# Patient Record
Sex: Male | Born: 1985
Health system: Southern US, Community
[De-identification: ages and names within clinical notes are randomized; demographics above are authoritative.]

## PROBLEM LIST (undated history)

## (undated) DIAGNOSIS — T7840XA Allergy, unspecified, initial encounter: Secondary | ICD-10-CM

## (undated) DIAGNOSIS — K219 Gastro-esophageal reflux disease without esophagitis: Secondary | ICD-10-CM

## (undated) DIAGNOSIS — J302 Other seasonal allergic rhinitis: Secondary | ICD-10-CM

## (undated) DIAGNOSIS — J45990 Exercise induced bronchospasm: Secondary | ICD-10-CM

## (undated) DIAGNOSIS — K5792 Diverticulitis of intestine, part unspecified, without perforation or abscess without bleeding: Secondary | ICD-10-CM

## (undated) HISTORY — DX: Exercise induced bronchospasm: J45.990

## (undated) HISTORY — DX: Allergy, unspecified, initial encounter: T78.40XA

## (undated) HISTORY — PX: OTHER SURGICAL HISTORY: SHX169

## (undated) HISTORY — DX: Other seasonal allergic rhinitis: J30.2

## (undated) HISTORY — PX: INNER EAR SURGERY: SHX679

## (undated) HISTORY — DX: Gastro-esophageal reflux disease without esophagitis: K21.9

## (undated) HISTORY — PX: NASAL SEPTUM SURGERY: SHX37

---

## 2004-02-14 ENCOUNTER — Other Ambulatory Visit: Payer: Self-pay

## 2004-02-18 ENCOUNTER — Other Ambulatory Visit: Payer: Self-pay

## 2009-05-25 ENCOUNTER — Ambulatory Visit: Payer: Self-pay | Admitting: Internal Medicine

## 2012-01-05 ENCOUNTER — Ambulatory Visit: Payer: Self-pay | Admitting: Otolaryngology

## 2012-08-14 DIAGNOSIS — K5792 Diverticulitis of intestine, part unspecified, without perforation or abscess without bleeding: Secondary | ICD-10-CM

## 2012-08-14 HISTORY — DX: Diverticulitis of intestine, part unspecified, without perforation or abscess without bleeding: K57.92

## 2013-04-03 ENCOUNTER — Ambulatory Visit: Payer: Self-pay | Admitting: Otolaryngology

## 2013-06-05 ENCOUNTER — Ambulatory Visit: Payer: Self-pay | Admitting: Otolaryngology

## 2013-07-14 HISTORY — PX: COLECTOMY: SHX59

## 2013-08-14 HISTORY — PX: WISDOM TOOTH EXTRACTION: SHX21

## 2013-08-14 HISTORY — PX: TONSILLECTOMY: SUR1361

## 2014-03-26 ENCOUNTER — Ambulatory Visit: Payer: Self-pay | Admitting: Otolaryngology

## 2015-05-13 ENCOUNTER — Encounter: Payer: Self-pay | Admitting: Family Medicine

## 2015-05-13 ENCOUNTER — Ambulatory Visit (INDEPENDENT_AMBULATORY_CARE_PROVIDER_SITE_OTHER): Payer: BLUE CROSS/BLUE SHIELD | Admitting: Family Medicine

## 2015-05-13 VITALS — BP 118/80 | HR 72 | Temp 98.8°F | Ht 71.0 in | Wt 205.0 lb

## 2015-05-13 DIAGNOSIS — J0100 Acute maxillary sinusitis, unspecified: Secondary | ICD-10-CM

## 2015-05-13 MED ORDER — AMOXICILLIN 500 MG PO CAPS: 500.0000 mg | ORAL_CAPSULE | Freq: Three times a day (TID) | ORAL | Status: DC

## 2015-05-13 MED ORDER — PREDNISONE 10 MG PO TABS: 10.0000 mg | ORAL_TABLET | Freq: Every day | ORAL | Status: DC

## 2015-05-13 MED ORDER — GUAIFENESIN-CODEINE 100-10 MG/5ML PO SOLN: 5.0000 mL | Freq: Three times a day (TID) | ORAL | Status: DC | PRN

## 2015-05-13 NOTE — Progress Notes (Signed)
Name: Brian Reeves   MRN: [id number]    DOB: 1986/06/07   Date:05/13/2015       Progress Note  Subjective  Chief Complaint  Chief Complaint  Patient presents with  . Sinusitis    yellow production, cough, fever in the pm, congestion    Sinusitis This is a new problem. The current episode started in the past 7 days. The problem has been gradually improving since onset. The maximum temperature recorded prior to his arrival was 100.4 - 100.9 F. The pain is moderate. Associated symptoms include chills, congestion, coughing, diaphoresis, headaches, sinus pressure and swollen glands. Pertinent negatives include no ear pain, neck pain, shortness of breath or sore throat. Past treatments include acetaminophen.    No problem-specific assessment & plan notes found for this encounter.   No past medical history on file.  Past Surgical History  Procedure Laterality Date  . Inner ear surgery      ruptured eardrum  . Meckel diverticulum excision    . Tonsillectomy    . Nasal septum surgery    . Arm fracture Left     No family history on file.  Social History   Social History  . Marital Status: Single    Spouse Name: N/A  . Number of Children: N/A  . Years of Education: N/A   Occupational History  . Not on file.   Social History Main Topics  . Smoking status: Never Smoker   . Smokeless tobacco: Not on file  . Alcohol Use: 0.0 oz/week    0 Standard drinks or equivalent per week  . Drug Use: No  . Sexual Activity: Not on file   Other Topics Concern  . Not on file   Social History Narrative  . No narrative on file    No Known Allergies   Review of Systems  Constitutional: Positive for chills and diaphoresis. Negative for fever, weight loss and malaise/fatigue.  HENT: Positive for congestion and sinus pressure. Negative for ear discharge, ear pain and sore throat.   Eyes: Negative for blurred vision.  Respiratory: Positive for cough. Negative for sputum production,  shortness of breath and wheezing.   Cardiovascular: Negative for chest pain, palpitations and leg swelling.  Gastrointestinal: Negative for heartburn, nausea, abdominal pain, diarrhea, constipation, blood in stool and melena.  Genitourinary: Negative for dysuria, urgency, frequency and hematuria.  Musculoskeletal: Negative for myalgias, back pain, joint pain and neck pain.  Skin: Negative for rash.  Neurological: Positive for headaches. Negative for dizziness, tingling, sensory change and focal weakness.  Endo/Heme/Allergies: Negative for environmental allergies and polydipsia. Does not bruise/bleed easily.  Psychiatric/Behavioral: Negative for depression and suicidal ideas. The patient is not nervous/anxious and does not have insomnia.      Objective  Filed Vitals:   05/13/15 1000  BP: 118/80  Pulse: 72  Temp: 98.8 F (37.1 C)  TempSrc: Oral  Height:  (1.803 m)  Weight: 205 lb (92.987 kg)    Physical Exam  Constitutional: He is oriented to person, place, and time and well-developed, well-nourished, and in no distress.  HENT:  Head: Normocephalic.  Right Ear: External ear normal.  Left Ear: External ear normal.  Nose: Nose normal.  Mouth/Throat: Oropharynx is clear and moist.  Eyes: Conjunctivae and EOM are normal. Pupils are equal, round, and reactive to light. Right eye exhibits no discharge. Left eye exhibits no discharge. No scleral icterus.  Neck: Normal range of motion. Neck supple. No JVD present. No tracheal deviation present. No  thyromegaly present.  Cardiovascular: Normal rate, regular rhythm, normal heart sounds and intact distal pulses.  Exam reveals no gallop and no friction rub.   No murmur heard. Pulmonary/Chest: Breath sounds normal. No respiratory distress. He has no wheezes. He has no rales.  Abdominal: Soft. Bowel sounds are normal. He exhibits no mass. There is no hepatosplenomegaly. There is no tenderness. There is no rebound, no guarding and no CVA  tenderness.  Musculoskeletal: Normal range of motion. He exhibits no edema or tenderness.  Lymphadenopathy:    He has no cervical adenopathy.  Neurological: He is alert and oriented to person, place, and time. He has normal sensation, normal strength, normal reflexes and intact cranial nerves. No cranial nerve deficit.  Skin: Skin is warm. No rash noted.  Psychiatric: Mood and affect normal.      Assessment & Plan  Problem List Items Addressed This Visit    None    Visit Diagnoses    Acute maxillary sinusitis, recurrence not specified    -  Primary    Relevant Medications    fexofenadine-pseudoephedrine (ALLEGRA-D 24) 180-240 MG 24 hr tablet    amoxicillin (AMOXIL) 500 MG capsule    guaiFENesin-codeine 100-10 MG/5ML syrup    predniSONE (DELTASONE) 10 MG tablet         Dr. [facility]  05/13/2015

## 2015-11-22 ENCOUNTER — Ambulatory Visit (INDEPENDENT_AMBULATORY_CARE_PROVIDER_SITE_OTHER): Payer: BLUE CROSS/BLUE SHIELD | Admitting: Family Medicine

## 2015-11-22 ENCOUNTER — Encounter: Payer: Self-pay | Admitting: Family Medicine

## 2015-11-22 ENCOUNTER — Telehealth: Payer: Self-pay

## 2015-11-22 VITALS — BP 130/70 | HR 78 | Ht 71.0 in | Wt 208.0 lb

## 2015-11-22 DIAGNOSIS — G4730 Sleep apnea, unspecified: Secondary | ICD-10-CM | POA: Diagnosis not present

## 2015-11-22 DIAGNOSIS — S161XXA Strain of muscle, fascia and tendon at neck level, initial encounter: Secondary | ICD-10-CM

## 2015-11-22 DIAGNOSIS — R194 Change in bowel habit: Secondary | ICD-10-CM

## 2015-11-22 DIAGNOSIS — R198 Other specified symptoms and signs involving the digestive system and abdomen: Secondary | ICD-10-CM | POA: Diagnosis not present

## 2015-11-22 DIAGNOSIS — J029 Acute pharyngitis, unspecified: Secondary | ICD-10-CM

## 2015-11-22 DIAGNOSIS — L723 Sebaceous cyst: Secondary | ICD-10-CM | POA: Diagnosis not present

## 2015-11-22 LAB — HEMOCCULT GUIAC POC 1CARD (OFFICE): FECAL OCCULT BLD: POSITIVE — AB

## 2015-11-22 MED ORDER — AZITHROMYCIN 250 MG PO TABS: ORAL_TABLET | ORAL | Status: DC

## 2015-11-22 NOTE — Telephone Encounter (Signed)
Dr. Servando SnareWohl told me that he needed to have a colonoscopy done this week since he's been having excessive bowel movements. However, patient doesn't want to have it done this week. Patient was told that you would give him a call with his procedure date.

## 2015-11-22 NOTE — Progress Notes (Signed)
Name: Brian Reeves   MRN: [id number]    DOB: 05/11/1986   Date:11/22/2015       Progress Note  Subjective  Chief Complaint  Chief Complaint  Patient presents with  . Sore Throat    cong- gets better during day, but gets worse at night  . Sleep Apnea    had tonsils removed, stopped snoring- snoring is back and has had witnessed apnea. needs CPAP again  . bowel habits    "excessive bowel movements"    HPI Comments: Patient present for evaluation for sleep apnea.  Patient has had witnessed apnea episodes/ snoring / frequent day time  somnolence/ nonrestful sleep.    Sore Throat  This is a new problem. The current episode started in the past 7 days. The problem has been waxing and waning. There has been no fever. The pain is mild. Associated symptoms include congestion, coughing, diarrhea and neck pain. Pertinent negatives include no abdominal pain, drooling, ear discharge, ear pain, headaches, hoarse voice, plugged ear sensation, shortness of breath, stridor, swollen glands, trouble swallowing or vomiting. He has tried acetaminophen for the symptoms. The treatment provided mild relief.  Neck Pain  This is a chronic problem. The current episode started more than 1 year ago. The problem occurs daily. The problem has been waxing and waning. The pain is associated with nothing. The quality of the pain is described as aching. Pertinent negatives include no chest pain, fever, headaches, leg pain, numbness, pain with swallowing, paresis, photophobia, syncope, tingling, trouble swallowing, visual change, weakness or weight loss. He has tried NSAIDs for the symptoms. The treatment provided mild relief.  Diarrhea  This is a chronic problem. The current episode started more than 1 year ago. The problem occurs 5 to 10 times per day. The problem has been waxing and waning. The stool consistency is described as blood tinged and mucous (tissue). The patient states that diarrhea does not awaken him from  sleep. Associated symptoms include bloating and coughing. Pertinent negatives include no abdominal pain, arthralgias, chills, fever, headaches, myalgias, vomiting or weight loss. Nothing aggravates the symptoms. There are no known risk factors. He has tried nothing for the symptoms. The treatment provided no relief. hx of meckels    No problem-specific assessment & plan notes found for this encounter.   History reviewed. No pertinent past medical history.  Past Surgical History  Procedure Laterality Date  . Inner ear surgery      ruptured eardrum  . Meckel diverticulum excision    . Tonsillectomy    . Nasal septum surgery    . Arm fracture Left     History reviewed. No pertinent family history.  Social History   Social History  . Marital Status: Single    Spouse Name: N/A  . Number of Children: N/A  . Years of Education: N/A   Occupational History  . Not on file.   Social History Main Topics  . Smoking status: Never Smoker   . Smokeless tobacco: Not on file  . Alcohol Use: 0.0 oz/week    0 Standard drinks or equivalent per week  . Drug Use: No  . Sexual Activity: Not on file   Other Topics Concern  . Not on file   Social History Narrative    No Known Allergies   Review of Systems  Constitutional: Negative for fever, chills, weight loss and malaise/fatigue.  HENT: Positive for congestion. Negative for drooling, ear discharge, ear pain, hoarse voice, sore throat and trouble swallowing.  Eyes: Negative for blurred vision and photophobia.  Respiratory: Positive for cough. Negative for sputum production, shortness of breath, wheezing and stridor.   Cardiovascular: Negative for chest pain, palpitations, leg swelling and syncope.  Gastrointestinal: Positive for diarrhea, blood in stool and bloating. Negative for heartburn, nausea, vomiting, abdominal pain, constipation and melena.  Genitourinary: Negative for dysuria, urgency, frequency and hematuria.   Musculoskeletal: Positive for neck pain. Negative for myalgias, back pain, joint pain and arthralgias.  Skin: Negative for rash.  Neurological: Negative for dizziness, tingling, sensory change, focal weakness, weakness, numbness and headaches.  Endo/Heme/Allergies: Negative for environmental allergies and polydipsia. Does not bruise/bleed easily.  Psychiatric/Behavioral: Negative for depression and suicidal ideas. The patient is not nervous/anxious and does not have insomnia.      Objective  Filed Vitals:   11/22/15 1359  BP: 130/70  Pulse: 78  Height:  (1.803 m)  Weight: 208 lb (94.348 kg)    Physical Exam  Constitutional: He is oriented to person, place, and time and well-developed, well-nourished, and in no distress.  HENT:  Head: Normocephalic.  Right Ear: External ear normal.  Left Ear: External ear normal.  Nose: Nose normal.  Mouth/Throat: Oropharynx is clear and moist.  Eyes: Conjunctivae and EOM are normal. Pupils are equal, round, and reactive to light. Right eye exhibits no discharge. Left eye exhibits no discharge. No scleral icterus.  Neck: Normal range of motion. Neck supple. No JVD present. No tracheal deviation present. No thyromegaly present.  Cardiovascular: Normal rate, regular rhythm, normal heart sounds and intact distal pulses.  Exam reveals no gallop and no friction rub.   No murmur heard. Pulmonary/Chest: Breath sounds normal. No respiratory distress. He has no wheezes. He has no rales.  Abdominal: Soft. Bowel sounds are normal. He exhibits no mass. There is no hepatosplenomegaly. There is no tenderness. There is no rebound, no guarding and no CVA tenderness.  Genitourinary: Rectal exam shows tenderness.  Tight anal opening// freq small bowel movement  ? Incomplete evacuation  Musculoskeletal: Normal range of motion. He exhibits no edema or tenderness.  Lymphadenopathy:    He has no cervical adenopathy.  Neurological: He is alert and oriented to  person, place, and time. He has normal sensation, normal strength, normal reflexes and intact cranial nerves. No cranial nerve deficit.  Skin: Skin is warm. No rash noted.  Psychiatric: Mood and affect normal.  Nursing note and vitals reviewed.     Assessment & Plan  Problem List Items Addressed This Visit    None    Visit Diagnoses    Acute pharyngitis, unspecified etiology    -  Primary    Sleep apnea        Relevant Orders    Ambulatory referral to Sleep Studies    Frequent bowel movements        small amounts/  small caliber/ occ blood/ hx of meckel's    Relevant Orders    POCT Occult Blood Stool (Completed)    Ambulatory referral to Gastroenterology    Cervical strain, initial encounter        Sebaceous cyst             Dr. [facility]  11/22/2015

## 2015-11-23 NOTE — Telephone Encounter (Signed)
Brian Reeves, I tried calling patient to let him know that Dr. Yetta BarreJones wanted him to be seen soon and had no answer. I had to leave a voicemail to call us back so we could schedule his colonoscopy this week. I left him my name and number.

## 2015-12-20 ENCOUNTER — Other Ambulatory Visit: Payer: Self-pay

## 2015-12-20 ENCOUNTER — Ambulatory Visit (INDEPENDENT_AMBULATORY_CARE_PROVIDER_SITE_OTHER): Payer: BLUE CROSS/BLUE SHIELD | Admitting: Gastroenterology

## 2015-12-20 ENCOUNTER — Encounter: Payer: Self-pay | Admitting: Gastroenterology

## 2015-12-20 VITALS — BP 132/70 | HR 93 | Temp 98.3°F | Ht 71.0 in | Wt 211.0 lb

## 2015-12-20 DIAGNOSIS — K921 Melena: Secondary | ICD-10-CM

## 2015-12-20 DIAGNOSIS — Q430 Meckel's diverticulum (displaced) (hypertrophic): Secondary | ICD-10-CM

## 2015-12-20 DIAGNOSIS — J45990 Exercise induced bronchospasm: Secondary | ICD-10-CM

## 2015-12-20 DIAGNOSIS — J302 Other seasonal allergic rhinitis: Secondary | ICD-10-CM

## 2015-12-20 HISTORY — DX: Other seasonal allergic rhinitis: J30.2

## 2015-12-20 HISTORY — DX: Exercise induced bronchospasm: J45.990

## 2015-12-20 NOTE — Telephone Encounter (Signed)
Pt [facility]. Pt was scheduled during an OV.

## 2015-12-20 NOTE — Progress Notes (Signed)
Gastroenterology Consultation  Referring Provider:     Duanne Limerick, MD Primary Care Physician:  Elizabeth Sauer, MD Primary Gastroenterologist:  Dr. Servando Snare     Reason for Consultation:     Rectal bleeding         HPI:   Brian Reeves is a 30 y.o. y/o male referred for consultation & management of Urgency with rectal bleeding by Dr. Elizabeth Sauer, MD.  This woman comes in today with a history of having severe abdominal pain in the past and underwent a surgery for resection of a diverticular lesion that he thinks may have been an abscess. The patient thinks he was told it was a cyst but is not sure why reaction remove part of his intestines. The patient now reports that he has had frequent bowel movements every day ever since. He usually moves his bowels approximate 4 times a day. There is no report of any unexplained weight loss. The patient has had rectal bleeding. Is no report of any family history of colon cancer colon polyps. The patient also reports that he has some intermittent abdominal pain. The patient reports that sometimes she will sit down to the bathroom and nothing will come out.  Past Medical History  Diagnosis Date  . Exercise-induced asthma 12/20/2015  . Seasonal allergies 12/20/2015  . Meckel diverticulum 12/20/2015    2014- Open Colectomy in [place]     Past Surgical History  Procedure Laterality Date  . Inner ear surgery  as child    ruptured eardrum  . Meckel diverticulum excision  07/2013    Open Colectomy - [place]  . Tonsillectomy  2015  . Nasal septum surgery  2015 and 2016  . Arm fracture Left   . Wisdom tooth extraction  2015    Prior to Admission medications   Medication Sig Start Date End Date Taking? Authorizing Provider  fexofenadine-pseudoephedrine (ALLEGRA-D 24) 180-240 MG 24 hr tablet Take 1 tablet by mouth daily. otc   Yes Historical Provider, MD  PROAIR HFA 108 (90 BASE) MCG/ACT inhaler Inhale 1 puff into the lungs as needed. 02/22/15   Yes Historical Provider, MD  triamcinolone (NASACORT ALLERGY 24HR) 55 MCG/ACT AERO nasal inhaler Place 2 sprays into the nose daily.   Yes Historical Provider, MD    Family History  Problem Relation Age of Onset  . Hypertension Father   . Cancer Maternal Aunt   . Diabetes Maternal Grandfather      Social History  Substance Use Topics  . Smoking status: Never Smoker   . Smokeless tobacco: Never Used  . Alcohol Use: 0.0 oz/week    0 Standard drinks or equivalent per week     Comment: 6 drinks liquor or wine weekly    Allergies as of 12/20/2015 - Review Complete 12/20/2015  Allergen Reaction Noted  . Peanut-containing drug products  12/20/2015    Review of Systems:    All systems reviewed and negative except where noted in HPI.   Physical Exam:  BP 132/70 mmHg  Pulse 93  Temp(Src) 98.3 F (36.8 C) (Oral)  Ht  (1.803 m)  Wt 211 lb (95.709 kg)  BMI 29.44 kg/m2 No LMP for male patient. Psych:  Alert and cooperative. Normal mood and affect. General:   Alert,  Well-developed, well-nourished, pleasant and cooperative in NAD Head:  Normocephalic and atraumatic. Eyes:  Sclera clear, no icterus.   Conjunctiva pink. Ears:  Normal auditory acuity. Nose:  No deformity, discharge, or lesions.  Mouth:  No deformity or lesions,oropharynx pink & moist. Neck:  Supple; no masses or thyromegaly. Lungs:  Respirations even and unlabored.  Clear throughout to auscultation.   No wheezes, crackles, or rhonchi. No acute distress. Heart:  Regular rate and rhythm; no murmurs, clicks, rubs, or gallops. Abdomen:  Normal bowel sounds.  No bruits.  Soft, non-tender and non-distended without masses, hepatosplenomegaly or hernias noted.  No guarding or rebound tenderness.  Negative Carnett sign.   Rectal:  Deferred.  Msk:  Symmetrical without gross deformities.  Good, equal movement & strength bilaterally. Pulses:  Normal pulses noted. Extremities:  No clubbing or edema.  No  cyanosis. Neurologic:  Alert and oriented x3;  grossly normal neurologically. Skin:  Intact without significant lesions or rashes.  No jaundice. Lymph Nodes:  No significant cervical adenopathy. Psych:  Alert and cooperative. Normal mood and affect.  Imaging Studies: No results found.  Assessment and Plan:   Brian Reeves is a 30 y.o. y/o male who comes in with frequent urgency with frequent bowel movements and rectal bleeding. The patient has been told that his frequency may be attributed to his colonic resection. The patient's rectal bleeding and rectal fullness may be due to inflammation of the rectum. The patient will be set up for a colonoscopy to look for the source of bleeding. I have discussed risks & benefits which include, but are not limited to, bleeding, infection, perforation & drug reaction.  The patient agrees with this plan & written consent will be obtained.      Note: This dictation was prepared with Dragon dictation along with smaller phrase technology. Any transcriptional errors that result from this process are unintentional.

## 2015-12-21 ENCOUNTER — Encounter: Payer: Self-pay | Admitting: *Deleted

## 2015-12-23 NOTE — Discharge Instructions (Signed)

## 2015-12-24 ENCOUNTER — Ambulatory Visit: Payer: BLUE CROSS/BLUE SHIELD | Admitting: Anesthesiology

## 2015-12-24 ENCOUNTER — Encounter
Admission: RE | Disposition: A | Discharge status: Home / Self Care | Payer: Self-pay | Source: Ambulatory Visit | Attending: Gastroenterology

## 2015-12-24 ENCOUNTER — Ambulatory Visit
Admission: RE | Admit: 2015-12-24 | Discharge: 2015-12-24 | Disposition: A | Discharge status: Home / Self Care | Payer: BLUE CROSS/BLUE SHIELD | Source: Ambulatory Visit | Attending: Gastroenterology | Admitting: Gastroenterology

## 2015-12-24 DIAGNOSIS — Z9049 Acquired absence of other specified parts of digestive tract: Secondary | ICD-10-CM | POA: Insufficient documentation

## 2015-12-24 DIAGNOSIS — Z7951 Long term (current) use of inhaled steroids: Secondary | ICD-10-CM | POA: Insufficient documentation

## 2015-12-24 DIAGNOSIS — K921 Melena: Secondary | ICD-10-CM | POA: Insufficient documentation

## 2015-12-24 DIAGNOSIS — Z87891 Personal history of nicotine dependence: Secondary | ICD-10-CM | POA: Insufficient documentation

## 2015-12-24 DIAGNOSIS — Z8249 Family history of ischemic heart disease and other diseases of the circulatory system: Secondary | ICD-10-CM | POA: Diagnosis not present

## 2015-12-24 DIAGNOSIS — Z833 Family history of diabetes mellitus: Secondary | ICD-10-CM | POA: Insufficient documentation

## 2015-12-24 DIAGNOSIS — K640 First degree hemorrhoids: Secondary | ICD-10-CM | POA: Insufficient documentation

## 2015-12-24 DIAGNOSIS — J302 Other seasonal allergic rhinitis: Secondary | ICD-10-CM | POA: Diagnosis not present

## 2015-12-24 DIAGNOSIS — Z79899 Other long term (current) drug therapy: Secondary | ICD-10-CM | POA: Diagnosis not present

## 2015-12-24 DIAGNOSIS — Z91010 Allergy to peanuts: Secondary | ICD-10-CM | POA: Diagnosis not present

## 2015-12-24 HISTORY — PX: COLONOSCOPY WITH PROPOFOL: SHX5780

## 2015-12-24 HISTORY — DX: Diverticulitis of intestine, part unspecified, without perforation or abscess without bleeding: K57.92

## 2015-12-24 SURGERY — COLONOSCOPY WITH PROPOFOL
Anesthesia: Monitor Anesthesia Care | Wound class: Contaminated

## 2015-12-24 MED ORDER — LACTATED RINGERS IV SOLN
INTRAVENOUS | Status: DC
  Administered 2015-12-24 (×2): via INTRAVENOUS

## 2015-12-24 MED ORDER — ACETAMINOPHEN 160 MG/5ML PO SOLN: 325.0000 mg | ORAL | Status: DC | PRN

## 2015-12-24 MED ORDER — PROPOFOL 10 MG/ML IV BOLUS
INTRAVENOUS | Status: DC | PRN
  Administered 2015-12-24: 50 mg via INTRAVENOUS
  Administered 2015-12-24 (×2): 20 mg via INTRAVENOUS
  Administered 2015-12-24: 100 mg via INTRAVENOUS
  Administered 2015-12-24 (×2): 50 mg via INTRAVENOUS
  Administered 2015-12-24: 20 mg via INTRAVENOUS
  Administered 2015-12-24: 50 mg via INTRAVENOUS
  Administered 2015-12-24 (×2): 20 mg via INTRAVENOUS

## 2015-12-24 MED ORDER — STERILE WATER FOR IRRIGATION IR SOLN
Status: DC | PRN
  Administered 2015-12-24: 10:00:00

## 2015-12-24 MED ORDER — LIDOCAINE HCL (CARDIAC) 20 MG/ML IV SOLN
INTRAVENOUS | Status: DC | PRN
  Administered 2015-12-24: 20 mg via INTRAVENOUS

## 2015-12-24 MED ORDER — ACETAMINOPHEN 325 MG PO TABS: 325.0000 mg | ORAL_TABLET | ORAL | Status: DC | PRN

## 2015-12-24 SURGICAL SUPPLY — 22 items
CANISTER SUCT 1200ML W/VALVE (MISCELLANEOUS) ×2 IMPLANT
CLIP HMST 235XBRD CATH ROT (MISCELLANEOUS) IMPLANT
CLIP RESOLUTION 360 11X235 (MISCELLANEOUS)
FCP ESCP3.2XJMB 240X2.8X (MISCELLANEOUS)
FORCEPS BIOP RAD 4 LRG CAP 4 (CUTTING FORCEPS) ×2 IMPLANT
FORCEPS BIOP RJ4 240 W/NDL (MISCELLANEOUS)
FORCEPS ESCP3.2XJMB 240X2.8X (MISCELLANEOUS) IMPLANT
GOWN CVR UNV OPN BCK APRN NK (MISCELLANEOUS) ×2 IMPLANT
GOWN ISOL THUMB LOOP REG UNIV (MISCELLANEOUS) ×2
INJECTOR VARIJECT VIN23 (MISCELLANEOUS) IMPLANT
KIT DEFENDO VALVE AND CONN (KITS) IMPLANT
KIT ENDO PROCEDURE OLY (KITS) ×2 IMPLANT
MARKER SPOT ENDO TATTOO 5ML (MISCELLANEOUS) IMPLANT
PAD GROUND ADULT SPLIT (MISCELLANEOUS) IMPLANT
PROBE APC STR FIRE (PROBE) IMPLANT
SNARE SHORT THROW 13M SML OVAL (MISCELLANEOUS) IMPLANT
SNARE SHORT THROW 30M LRG OVAL (MISCELLANEOUS) IMPLANT
SNARE SNG USE RND 15MM (INSTRUMENTS) IMPLANT
SPOT EX ENDOSCOPIC TATTOO (MISCELLANEOUS)
TRAP ETRAP POLY (MISCELLANEOUS) IMPLANT
VARIJECT INJECTOR VIN23 (MISCELLANEOUS)
WATER STERILE IRR 250ML POUR (IV SOLUTION) ×2 IMPLANT

## 2015-12-24 NOTE — Anesthesia Postprocedure Evaluation (Signed)
Anesthesia Post Note  Patient: Brian Reeves  Procedure(s) Performed: Procedure(s) (LRB): COLONOSCOPY WITH PROPOFOL (N/A)  Patient location during evaluation: PACU Anesthesia Type: MAC Level of consciousness: awake and alert and oriented Pain management: satisfactory to patient Vital Signs Assessment: post-procedure vital signs reviewed and stable Respiratory status: spontaneous breathing, nonlabored ventilation and respiratory function stable Cardiovascular status: blood pressure returned to baseline and stable Postop Assessment: Adequate PO intake and No signs of nausea or vomiting Anesthetic complications: no    Cherly BeachStella, Zaleigh Bermingham J

## 2015-12-24 NOTE — Transfer of Care (Signed)
Immediate Anesthesia Transfer of Care Note  Patient: Brian Reeves  Procedure(s) Performed: Procedure(s) with comments: COLONOSCOPY WITH PROPOFOL (N/A) - LEAVE PT LAST  Patient Location: PACU  Anesthesia Type: MAC  Level of Consciousness: awake, alert  and patient cooperative  Airway and Oxygen Therapy: Patient Spontanous Breathing and Patient connected to supplemental oxygen  Post-op Assessment: Post-op Vital signs reviewed, Patient's Cardiovascular Status Stable, Respiratory Function Stable, Patent Airway and No signs of Nausea or vomiting  Post-op Vital Signs: Reviewed and stable  Complications: No apparent anesthesia complications

## 2015-12-24 NOTE — H&P (Signed)
  [facility]  [address] [place], [place] [address] Phone: [phone] Fax : [phone]-[facility]304-1083  Primary Care Physician:  Elizabeth Sauereanna Jones, MD Primary Gastroenterologist:  Dr. Servando SnareWohl  Pre-Procedure History & Physical: HPI:  Brian MareDouglas Reeves Duggin is a 30 y.o. male is here for an colonoscopy.   Past Medical History  Diagnosis Date  . Exercise-induced asthma 12/20/2015  . Seasonal allergies 12/20/2015  . Diverticulitis 2014    [place]    Past Surgical History  Procedure Laterality Date  . Inner ear surgery  as child    ruptured eardrum  . Tonsillectomy  2015  . Nasal septum surgery  2015 and 2016  . Arm fracture Left   . Wisdom tooth extraction  2015  . [place]    Prior to Admission medications   Medication Sig Start Date End Date Taking? Authorizing Provider  fexofenadine-pseudoephedrine (ALLEGRA-D 24) 180-240 MG 24 hr tablet Take 1 tablet by mouth daily. otc   Yes Historical Provider, MD  PROAIR HFA 108 (90 BASE) MCG/ACT inhaler Inhale 1 puff into the lungs as needed. 02/22/15  Yes Historical Provider, MD  triamcinolone (NASACORT ALLERGY 24HR) 55 MCG/ACT AERO nasal inhaler Place 2 sprays into the nose daily.   Yes Historical Provider, MD    Allergies as of 12/20/2015 - Review Complete 12/20/2015  Allergen Reaction Noted  . Peanut-containing drug products  12/20/2015    Family History  Problem Relation Age of Onset  . Hypertension Father   . Cancer Maternal Aunt   . Diabetes Maternal Grandfather     Social History   Social History  . Marital Status: Single    Spouse Name: N/A  . Number of Children: N/A  . Years of Education: N/A   Occupational History  . Not on file.   Social History Main Topics  . Smoking status: Former Smoker -- 0.50 packs/day for 3 years    Types: Cigarettes  . Smokeless tobacco: Never Used     Comment: quit approx 2009  . Alcohol Use: 3.6 oz/week    0 Standard drinks  or equivalent, 3 Glasses of wine, 3 Shots of liquor per week     Comment:    . Drug Use: No  . Sexual Activity: Not on file   Other Topics Concern  . Not on file   Social History Narrative    Review of Systems: See HPI, otherwise negative ROS  Physical Exam: BP 116/64 mmHg  Pulse 78  Temp(Src) 96.7 F (35.9 Reeves) (Temporal)  Resp 16  Ht 5\' 11"  (1.803 m)  Wt 200 lb (90.719 kg)  BMI 27.91 kg/m2  SpO2 99% General:   Alert,  pleasant and cooperative in NAD Head:  Normocephalic and atraumatic. Neck:  Supple; no masses or thyromegaly. Lungs:  Clear throughout to auscultation.    Heart:  Regular rate and rhythm. Abdomen:  Soft, nontender and nondistended. Normal bowel sounds, without guarding, and without rebound.   Neurologic:  Alert and  oriented x4;  grossly normal neurologically.  Impression/Plan: Brian Reeves Bonnet is here for an colonoscopy to be performed for hematochezia  Risks, benefits, limitations, and alternatives regarding  colonoscopy have been reviewed with the patient.  Questions have been answered.  All parties agreeable.   Midge Miniumarren Aundra Espin, MD  12/24/2015, 8:44 AM

## 2015-12-24 NOTE — Anesthesia Preprocedure Evaluation (Signed)
Anesthesia Evaluation  Patient identified by MRN, date of birth, ID band  Reviewed: Allergy & Precautions, H&P , NPO status , Patient's Chart, lab work & pertinent test results  Airway Mallampati: II  TM Distance: >3 FB Neck ROM: full    Dental no notable dental hx.    Pulmonary asthma , former smoker,    Pulmonary exam normal        Cardiovascular  Rhythm:regular Rate:Normal     Neuro/Psych    GI/Hepatic   Endo/Other    Renal/GU      Musculoskeletal   Abdominal   Peds  Hematology   Anesthesia Other Findings   Reproductive/Obstetrics                             Anesthesia Physical Anesthesia Plan  ASA: II  Anesthesia Plan: MAC   Post-op Pain Management:    Induction:   Airway Management Planned:   Additional Equipment:   Intra-op Plan:   Post-operative Plan:   Informed Consent: I have reviewed the patients History and Physical, chart, labs and discussed the procedure including the risks, benefits and alternatives for the proposed anesthesia with the patient or authorized representative who has indicated his/her understanding and acceptance.     Plan Discussed with: CRNA  Anesthesia Plan Comments:         Anesthesia Quick Evaluation

## 2015-12-24 NOTE — Op Note (Signed)
[facility] Gastroenterology Patient Name: Brian Reeves Procedure Date: 12/24/2015 9:18 AM MRN: [id number] Account #: [id number] Date of Birth: [birth year] Admit Type: Outpatient Age: 30 Room: [facility] OR ROOM 01 Gender: Male Note Status: Finalized Procedure:            Colonoscopy Indications:          Hematochezia Providers:            Midge Minium, MD Referring MD:         Duanne Limerick, MD (Referring MD) Medicines:            Propofol per Anesthesia Complications:        No immediate complications. Procedure:            Pre-Anesthesia Assessment:                       - Prior to the procedure, a History and Physical was                        performed, and patient medications and allergies were                        reviewed. The patient's tolerance of previous                        anesthesia was also reviewed. The risks and benefits of                        the procedure and the sedation options and risks were                        discussed with the patient. All questions were                        answered, and informed consent was obtained. Prior                        Anticoagulants: The patient has taken no previous                        anticoagulant or antiplatelet agents. ASA Grade                        Assessment: II - A patient with mild systemic disease.                        After reviewing the risks and benefits, the patient was                        deemed in satisfactory condition to undergo the                        procedure.                       After obtaining informed consent, the colonoscope was                        passed under direct vision. Throughout the procedure,  the patient's blood pressure, pulse, and oxygen                        saturations were monitored continuously. The Olympus CF                        H180AL colonoscope (S#: [id number]) was introduced through                        the anus  and advanced to the the terminal ileum. The                        colonoscopy was performed without difficulty. The                        patient tolerated the procedure well. The quality of                        the bowel preparation was excellent. Findings:      The perianal and digital rectal examinations were normal.      Non-bleeding internal hemorrhoids were found during retroflexion. The       hemorrhoids were Grade I (internal hemorrhoids that do not prolapse).      The terminal ileum appeared normal. Biopsies were taken with a cold       forceps for histology.      Biopsies were taken with a cold forceps in the entire colon for       histology. Biopsies were taken with a cold forceps in the entire colon       for histology. Impression:           - Non-bleeding internal hemorrhoids.                       - The examined portion of the ileum was normal.                        Biopsied.                       - Biopsies were taken with a cold forceps for histology                        in the entire colon.                       - Biopsies were taken with a cold forceps for histology                        in the entire colon. Recommendation:       - Await pathology results. Procedure Code(s):    --- Professional ---                       [phone], Colonoscopy, flexible; with biopsy, single or                        multiple Diagnosis Code(s):    --- Professional ---                       K92.1, Melena (includes Hematochezia) CPT copyright 2016 American Medical Association.  All rights reserved. The codes documented in this report are preliminary and upon coder review may  be revised to meet current compliance requirements. Midge Minium, MD 12/24/2015 9:39:34 AM This report has been signed electronically. Number of Addenda: 0 Note Initiated On: 12/24/2015 9:18 AM Scope Withdrawal Time: 0 hours 6 minutes 45 seconds  Total Procedure Duration: 0 hours 10 minutes 5 seconds        [facility]

## 2015-12-24 NOTE — Anesthesia Procedure Notes (Signed)
Procedure Name: MAC Performed by: Eunie Lawn Pre-anesthesia Checklist: Patient identified, Emergency Drugs available, Suction available, Patient being monitored and Timeout performed Patient Re-evaluated:Patient Re-evaluated prior to inductionOxygen Delivery Method: Nasal cannula       

## 2015-12-27 ENCOUNTER — Encounter: Payer: Self-pay | Admitting: Gastroenterology

## 2015-12-28 ENCOUNTER — Encounter: Payer: Self-pay | Admitting: Gastroenterology

## 2016-09-15 ENCOUNTER — Ambulatory Visit (INDEPENDENT_AMBULATORY_CARE_PROVIDER_SITE_OTHER): Payer: 59 | Admitting: Family Medicine

## 2016-09-15 ENCOUNTER — Encounter: Payer: Self-pay | Admitting: Family Medicine

## 2016-09-15 VITALS — BP 100/62 | HR 72 | Temp 99.5°F | Ht 71.0 in | Wt 209.0 lb

## 2016-09-15 DIAGNOSIS — J40 Bronchitis, not specified as acute or chronic: Secondary | ICD-10-CM | POA: Diagnosis not present

## 2016-09-15 DIAGNOSIS — R69 Illness, unspecified: Secondary | ICD-10-CM

## 2016-09-15 DIAGNOSIS — J111 Influenza due to unidentified influenza virus with other respiratory manifestations: Secondary | ICD-10-CM

## 2016-09-15 MED ORDER — GUAIFENESIN-CODEINE 100-10 MG/5ML PO SYRP: 5.0000 mL | ORAL_SOLUTION | Freq: Three times a day (TID) | ORAL | 0 refills | Status: DC | PRN

## 2016-09-15 MED ORDER — OSELTAMIVIR PHOSPHATE 75 MG PO CAPS: 75.0000 mg | ORAL_CAPSULE | Freq: Two times a day (BID) | ORAL | 0 refills | Status: DC

## 2016-09-15 MED ORDER — AZITHROMYCIN 250 MG PO TABS: ORAL_TABLET | ORAL | 1 refills | Status: DC

## 2016-09-15 NOTE — Progress Notes (Signed)
Name: Brian Reeves   MRN: [id number]    DOB: 04/30/1986   Date:09/15/2016       Progress Note  Subjective  Chief Complaint  Chief Complaint  Patient presents with  . Sinusitis    green production, fever 100.0- gets sick 50 % of the time flying    Sinusitis  This is a new problem. The current episode started in the past 7 days. The problem has been waxing and waning since onset. The maximum temperature recorded prior to his arrival was 100.4 - 100.9 F. The fever has been present for 1 to 2 days. Associated symptoms include chills, congestion and coughing. Pertinent negatives include no diaphoresis, ear pain, headaches, hoarse voice, neck pain, shortness of breath, sinus pressure, sneezing, sore throat or swollen glands. Past treatments include nothing. The treatment provided mild relief.  Cough  This is a chronic problem. The current episode started in the past 7 days. The problem has been waxing and waning. The cough is productive of purulent sputum (yellow). Associated symptoms include chills, a fever, myalgias, nasal congestion, postnasal drip and wheezing. Pertinent negatives include no chest pain, ear congestion, ear pain, headaches, heartburn, hemoptysis, rash, rhinorrhea, sore throat, shortness of breath, sweats or weight loss. The treatment provided mild relief. There is no history of environmental allergies.  Fever   This is a new problem. The current episode started in the past 7 days. The problem occurs intermittently. The problem has been waxing and waning. The maximum temperature noted was 100 to 100.9 F. The temperature was taken using an oral thermometer. Associated symptoms include congestion, coughing, muscle aches and wheezing. Pertinent negatives include no abdominal pain, chest pain, diarrhea, ear pain, headaches, nausea, rash, sore throat or urinary pain. The treatment provided mild relief.    No problem-specific Assessment & Plan notes found for this encounter.   Past  Medical History:  Diagnosis Date  . Diverticulitis 2014   [place]lect[place] in [place]  . Exercise-induced asthma 12/20/2015  . Seasonal allergies 12/20/2015    Past Surgical History:  Procedure Laterality Date  . arm fracture Left   . [place]  07[place]   [place]  . COLONOSCOPY WITH PROPOFOL N/A 12/24/2015   Procedure: COLONOSCOPY WITH PROPOFOL;  Surgeon: Midge Miniumarren Wohl, MD;  [facility];  Service: Endoscopy;  Laterality: N/A;  LEAVE PT LAST  . INNER EAR SURGERY  as child   ruptured eardrum  . NASAL SEPTUM SURGERY  2015 and 2016  . TONSILLECTOMY  2015  . WISDOM TOOTH EXTRACTION  2015    Family History  Problem Relation Age of Onset  . Hypertension Father   . Cancer Maternal Aunt   . Diabetes Maternal Grandfather     Social History   Social History  . Marital status: Single    Spouse name: N/A  . Number of children: N/A  . Years of education: N/A   Occupational History  . Not on file.   Social History Main Topics  . Smoking status: Former Smoker    Packs/day: 0.50    Years: 3.00    Types: Cigarettes  . Smokeless tobacco: Never Used     Comment: quit approx 2009  . Alcohol use 3.6 oz/week    3 Glasses of wine, 3 Shots of liquor per week     Comment:    . Drug use: No  . Sexual activity: Not on file   Other Topics Concern  . Not on file  Social History Narrative  . No narrative on file    Allergies  Allergen Reactions  . Peanut-Containing Drug Products Anaphylaxis    (reaction to all nuts)     Review of Systems  Constitutional: Positive for chills and fever. Negative for diaphoresis, malaise/fatigue and weight loss.  HENT: Positive for congestion and postnasal drip. Negative for ear discharge, ear pain, hoarse voice, rhinorrhea, sinus pressure, sneezing and sore throat.   Eyes: Negative for blurred vision.  Respiratory: Positive for cough and wheezing. Negative for hemoptysis, sputum production and shortness of breath.    Cardiovascular: Negative for chest pain, palpitations and leg swelling.  Gastrointestinal: Negative for abdominal pain, blood in stool, constipation, diarrhea, heartburn, melena and nausea.  Genitourinary: Negative for dysuria, frequency, hematuria and urgency.  Musculoskeletal: Positive for myalgias. Negative for back pain, joint pain and neck pain.  Skin: Negative for rash.  Neurological: Negative for dizziness, tingling, sensory change, focal weakness and headaches.  Endo/Heme/Allergies: Negative for environmental allergies and polydipsia. Does not bruise/bleed easily.  Psychiatric/Behavioral: Negative for depression and suicidal ideas. The patient is not nervous/anxious and does not have insomnia.      Objective  Vitals:   09/15/16 0958  BP: 100/62  Pulse: 72  Temp: 99.5 F (37.5 C)  TempSrc: Oral  Weight: 209 lb (94.8 kg)  Height: 5\' 11"  (1.803 m)    Physical Exam  Constitutional: He is oriented to person, place, and time and well-developed, well-nourished, and in no distress.  HENT:  Head: Normocephalic.  Right Ear: External ear normal.  Left Ear: External ear normal.  Nose: Nose normal.  Mouth/Throat: Oropharynx is clear and moist.  Eyes: Conjunctivae and EOM are normal. Pupils are equal, round, and reactive to light. Right eye exhibits no discharge. Left eye exhibits no discharge. No scleral icterus.  Neck: Normal range of motion. Neck supple. No JVD present. No tracheal deviation present. No thyromegaly present.  Cardiovascular: Normal rate, regular rhythm, normal heart sounds and intact distal pulses.  Exam reveals no gallop and no friction rub.   No murmur heard. Pulmonary/Chest: Breath sounds normal. No respiratory distress. He has no wheezes. He has no rales.  Abdominal: Soft. Bowel sounds are normal. He exhibits no mass. There is no hepatosplenomegaly. There is no tenderness. There is no rebound, no guarding and no CVA tenderness.  Musculoskeletal: Normal range  of motion. He exhibits no edema or tenderness.  Lymphadenopathy:    He has no cervical adenopathy.  Neurological: He is alert and oriented to person, place, and time. He has normal sensation, normal strength, normal reflexes and intact cranial nerves. No cranial nerve deficit.  Skin: Skin is warm. No rash noted.  Psychiatric: Mood and affect normal.  Nursing note and vitals reviewed.     Assessment & Plan  Problem List Items Addressed This Visit    None    Visit Diagnoses    Influenza-like illness    -  Primary   Relevant Medications   oseltamivir (TAMIFLU) 75 MG capsule   Bronchitis       Relevant Medications   oseltamivir (TAMIFLU) 75 MG capsule   azithromycin (ZITHROMAX) 250 MG tablet   guaiFENesin-codeine (ROBITUSSIN AC) 100-10 MG/5ML syrup        Dr. [facility]  09/15/16

## 2017-09-28 ENCOUNTER — Encounter: Payer: Self-pay | Admitting: Family Medicine

## 2017-09-28 ENCOUNTER — Ambulatory Visit: Payer: 59 | Admitting: Family Medicine

## 2017-09-28 VITALS — BP 120/80 | HR 100 | Temp 98.0°F | Ht 71.0 in | Wt 221.0 lb

## 2017-09-28 DIAGNOSIS — Z23 Encounter for immunization: Secondary | ICD-10-CM

## 2017-09-28 DIAGNOSIS — E860 Dehydration: Secondary | ICD-10-CM

## 2017-09-28 DIAGNOSIS — R509 Fever, unspecified: Secondary | ICD-10-CM | POA: Diagnosis not present

## 2017-09-28 DIAGNOSIS — A084 Viral intestinal infection, unspecified: Secondary | ICD-10-CM | POA: Diagnosis not present

## 2017-09-28 LAB — POCT INFLUENZA A/B
INFLUENZA A, POC: NEGATIVE
INFLUENZA B, POC: NEGATIVE

## 2017-09-28 MED ORDER — PROMETHAZINE HCL 25 MG PO TABS: 25.0000 mg | ORAL_TABLET | Freq: Three times a day (TID) | ORAL | 0 refills | Status: DC | PRN

## 2017-09-28 NOTE — Patient Instructions (Signed)

## 2017-09-28 NOTE — Progress Notes (Signed)
Name: Brian Reeves   MRN: [id number]    DOB: 05/20/1986   Date:09/28/2017       Progress Note  Subjective  Chief Complaint  Chief Complaint  Patient presents with  . Fever    chills, body aches, vomitting, diarrhea    Fever   This is a new problem. The current episode started yesterday (last night). The problem occurs intermittently. The problem has been waxing and waning. The maximum temperature noted was 99 to 99.9 F. Associated symptoms include diarrhea, headaches, muscle aches, nausea and vomiting. Pertinent negatives include no abdominal pain, chest pain, congestion, coughing, ear pain, rash, sleepiness, sore throat, urinary pain or wheezing. He has tried fluids for the symptoms. The treatment provided mild relief.  Risk factors: sick contacts     No problem-specific Assessment & Plan notes found for this encounter.   Past Medical History:  Diagnosis Date  . Diverticulitis 2014   [place]  . Exercise-induced asthma 12/20/2015  . Seasonal allergies 12/20/2015    Past Surgical History:  Procedure Laterality Date  . arm fracture Left   . [place]LECTOMY  07/2013   [place]  . COLONOSCOPY WITH PROPOFOL N/A 12/24/2015   Procedure: COLONOSCOPY WITH PROPOFOL;  Surgeon: Midge Miniumarren Wohl, MD;  [facility];  Service: Endoscopy;  Laterality: N/A;  LEAVE PT LAST  . INNER EAR SURGERY  as child   ruptured eardrum  . NASAL SEPTUM SURGERY  2015 and 2016  . TONSILLECTOMY  2015  . WISDOM TOOTH EXTRACTION  2015    Family History  Problem Relation Age of Onset  . Hypertension Father   . Cancer Maternal Aunt   . Diabetes Maternal Grandfather     Social History   Socioeconomic History  . Marital status: Single    Spouse name: Not on file  . Number of children: Not on file  . Years of education: Not on file  . Highest education level: Not on file  Social Needs  . Financial resource strain: Not on file  . Food insecurity - worry: Not  on file  . Food insecurity - inability: Not on file  . Transportation needs - medical: Not on file  . Transportation needs - non-medical: Not on file  Occupational History  . Not on file  Tobacco Use  . Smoking status: Former Smoker    Packs/day: 0.50    Years: 3.00    Pack years: 1.50    Types: Cigarettes  . Smokeless tobacco: Never Used  . Tobacco comment: quit approx 2009  Substance and Sexual Activity  . Alcohol use: Yes    Alcohol/week: 3.6 oz    Types: 3 Glasses of wine, 3 Shots of liquor per week    Comment:    . Drug use: No  . Sexual activity: Not on file  Other Topics Concern  . Not on file  Social History Narrative  . Not on file    Allergies  Allergen Reactions  . Peanut-Containing Drug Products Anaphylaxis    (reaction to all nuts)    Outpatient Medications Prior to Visit  Medication Sig Dispense Refill  . fexofenadine-pseudoephedrine (ALLEGRA-D 24) 180-240 MG 24 hr tablet Take 1 tablet by mouth daily. otc    . PROAIR HFA 108 (90 BASE) MCG/ACT inhaler Inhale 1 puff into the lungs as needed.  0  . triamcinolone (NASACORT ALLERGY 24HR) 55 MCG/ACT AERO nasal inhaler Place 2 sprays into the nose daily.    .Marland Kitchen  azithromycin (ZITHROMAX) 250 MG tablet 2 today then 1 a day for 4 day 6 tablet 1  . guaiFENesin-codeine (ROBITUSSIN AC) 100-10 MG/5ML syrup Take 5 mLs by mouth 3 (three) times daily as needed for cough. 150 mL 0  . oseltamivir (TAMIFLU) 75 MG capsule Take 1 capsule (75 mg total) by mouth 2 (two) times daily. 10 capsule 0   No facility-administered medications prior to visit.     Review of Systems  Constitutional: Positive for fever. Negative for chills, malaise/fatigue and weight loss.  HENT: Negative for congestion, ear discharge, ear pain and sore throat.   Eyes: Negative for blurred vision.  Respiratory: Negative for cough, sputum production, shortness of breath and wheezing.   Cardiovascular: Negative for chest pain, palpitations and leg swelling.   Gastrointestinal: Positive for diarrhea, nausea and vomiting. Negative for abdominal pain, blood in stool, constipation, heartburn and melena.  Genitourinary: Negative for dysuria, frequency, hematuria and urgency.  Musculoskeletal: Negative for back pain, joint pain, myalgias and neck pain.  Skin: Negative for rash.  Neurological: Positive for headaches. Negative for dizziness, tingling, sensory change and focal weakness.  Endo/Heme/Allergies: Negative for environmental allergies and polydipsia. Does not bruise/bleed easily.  Psychiatric/Behavioral: Negative for depression and suicidal ideas. The patient is not nervous/anxious and does not have insomnia.      Objective  Vitals:   09/28/17 0845  BP: 120/80  Pulse: 100  Temp: 98 F (36.7 C)  TempSrc: Oral  Weight: 221 lb (100.2 kg)  Height: 5\' 11"  (1.803 m)    Physical Exam  Constitutional: He is oriented to person, place, and time and well-developed, well-nourished, and in no distress.  HENT:  Head: Normocephalic.  Right Ear: Tympanic membrane, external ear and ear canal normal.  Left Ear: Tympanic membrane, external ear and ear canal normal.  Nose: Nose normal.  Mouth/Throat: Oropharynx is clear and moist. Mucous membranes are dry. No oropharyngeal exudate, posterior oropharyngeal edema or posterior oropharyngeal erythema.  Eyes: Conjunctivae and EOM are normal. Pupils are equal, round, and reactive to light. Right eye exhibits no discharge. Left eye exhibits no discharge. No scleral icterus.  Neck: Normal range of motion. Neck supple. No JVD present. No tracheal deviation present. No thyromegaly present.  Cardiovascular: Normal rate, regular rhythm, normal heart sounds and intact distal pulses. Exam reveals no gallop and no friction rub.  No murmur heard. Pulmonary/Chest: Breath sounds normal. No respiratory distress. He has no wheezes. He has no rales.  Abdominal: Soft. Normal aorta and bowel sounds are normal. He exhibits no  mass. There is no hepatosplenomegaly. There is no tenderness. There is no rebound, no guarding and no CVA tenderness.  Musculoskeletal: Normal range of motion. He exhibits no edema or tenderness.  Lymphadenopathy:    He has no cervical adenopathy.  Neurological: He is alert and oriented to person, place, and time. He has normal sensation, normal strength, normal reflexes and intact cranial nerves. No cranial nerve deficit.  Skin: Skin is warm. No rash noted.  Psychiatric: Mood and affect normal.  Nursing note and vitals reviewed.     Assessment & Plan  Problem List Items Addressed This Visit    None    Visit Diagnoses    Fever and chills    -  Primary   Relevant Orders   POCT Influenza A/B (Completed)   Viral gastroenteritis       imodium   Relevant Medications   promethazine (PHENERGAN) 25 MG tablet   Other Relevant Orders   POCT Influenza  A/B (Completed)   Dehydration       Need for Tdap vaccination       Relevant Orders   Tdap vaccine greater than or equal to 7yo IM (Completed)      Meds ordered this encounter  Medications  . promethazine (PHENERGAN) 25 MG tablet    Sig: Take 1 tablet (25 mg total) by mouth every 8 (eight) hours as needed for nausea or vomiting.    Dispense:  16 tablet    Refill:  0      Dr. Elizabeth Sauer [facility]  09/28/17

## 2018-05-08 ENCOUNTER — Ambulatory Visit: Payer: 59 | Admitting: Family Medicine

## 2018-05-08 ENCOUNTER — Encounter: Payer: Self-pay | Admitting: Family Medicine

## 2018-05-08 VITALS — BP 130/70 | HR 100 | Temp 98.3°F | Ht 71.0 in | Wt 220.0 lb

## 2018-05-08 DIAGNOSIS — J40 Bronchitis, not specified as acute or chronic: Secondary | ICD-10-CM | POA: Diagnosis not present

## 2018-05-08 DIAGNOSIS — J0100 Acute maxillary sinusitis, unspecified: Secondary | ICD-10-CM

## 2018-05-08 MED ORDER — GUAIFENESIN-CODEINE 100-10 MG/5ML PO SYRP
5.0000 mL | ORAL_SOLUTION | Freq: Three times a day (TID) | ORAL | 0 refills | Status: DC | PRN
Start: 2018-05-08 — End: 2018-10-09

## 2018-05-08 MED ORDER — AMOXICILLIN-POT CLAVULANATE 875-125 MG PO TABS: 1.0000 | ORAL_TABLET | Freq: Two times a day (BID) | ORAL | 0 refills | Status: DC

## 2018-05-08 NOTE — Progress Notes (Signed)
Date:  05/08/2018   Name:  Brian Reeves   DOB:  1985/09/13   MRN:  [id number]   Chief Complaint: Sore Throat (fever, nasal congestion, thick, yellow production) Sore Throat   This is a new problem. The current episode started yesterday. The problem has been gradually worsening. The maximum temperature recorded prior to his arrival was 100.4 - 100.9 F. The pain is at a severity of 4/10. The pain is moderate. Associated symptoms include congestion, headaches and a hoarse voice. Pertinent negatives include no abdominal pain, coughing, diarrhea, drooling, ear discharge, ear pain, neck pain, shortness of breath, swollen glands, trouble swallowing or vomiting.  Sinusitis  This is a new problem. The current episode started in the past 7 days. The maximum temperature recorded prior to his arrival was 100.4 - 100.9 F. His pain is at a severity of 3/10. The pain is moderate. Associated symptoms include congestion, headaches and a hoarse voice. Pertinent negatives include no chills, coughing, diaphoresis, ear pain, neck pain, shortness of breath, sinus pressure, sneezing, sore throat or swollen glands. The treatment provided moderate relief.     Review of Systems  Constitutional: Negative for chills, diaphoresis and fever.  HENT: Positive for congestion and hoarse voice. Negative for drooling, ear discharge, ear pain, sinus pressure, sneezing, sore throat and trouble swallowing.   Respiratory: Negative for cough, shortness of breath and wheezing.   Cardiovascular: Negative for chest pain, palpitations and leg swelling.  Gastrointestinal: Negative for abdominal pain, blood in stool, constipation, diarrhea, nausea and vomiting.  Endocrine: Negative for polydipsia.  Genitourinary: Negative for dysuria, frequency, hematuria and urgency.  Musculoskeletal: Negative for back pain, myalgias and neck pain.  Skin: Negative for rash.  Allergic/Immunologic: Negative for environmental allergies.    Neurological: Positive for headaches. Negative for dizziness.  Hematological: Does not bruise/bleed easily.  Psychiatric/Behavioral: Negative for suicidal ideas. The patient is not nervous/anxious.     Patient Active Problem List   Diagnosis Date Noted  . Blood in stool   . Meckel diverticulum 12/20/2015  . Seasonal allergies 12/20/2015  . Exercise-induced asthma 12/20/2015    Allergies  Allergen Reactions  . Peanut-Containing Drug Products Anaphylaxis    (reaction to all nuts)    Past Surgical History:  Procedure Laterality Date  . arm fracture Left   . COLECTOMY  07/2013   [place]  . COLONOSCOPY WITH PROPOFOL N/A 12/24/2015   Procedure: COLONOSCOPY WITH PROPOFOL;  Surgeon: Midge Minium, MD;  [facility];  Service: Endoscopy;  Laterality: N/A;  LEAVE PT LAST  . INNER EAR SURGERY  as child   ruptured eardrum  . NASAL SEPTUM SURGERY  2015 and 2016  . TONSILLECTOMY  2015  . WISDOM TOOTH EXTRACTION  2015    Social History   Tobacco Use  . Smoking status: Former Smoker    Packs/day: 0.50    Years: 3.00    Pack years: 1.50    Types: Cigarettes  . Smokeless tobacco: Never Used  . Tobacco comment: quit approx 2009  Substance Use Topics  . Alcohol use: Yes    Alcohol/week: 6.0 standard drinks    Types: 3 Glasses of wine, 3 Shots of liquor per week    Comment:    . Drug use: No     Medication list has been reviewed and updated.  Current Meds  Medication Sig  . triamcinolone (NASACORT ALLERGY 24HR) 55 MCG/ACT AERO nasal inhaler Place 2 sprays into the nose daily.  PHQ 2/9 Scores 09/28/2017 11/22/2015  PHQ - 2 Score 0 0  PHQ- 9 Score 0 -    Physical Exam  Constitutional: He is oriented to person, place, and time. He appears well-developed.  HENT:  Head: Normocephalic.  Right Ear: Tympanic membrane, external ear and ear canal normal.  Left Ear: Tympanic membrane, external ear and ear canal normal.  Nose: Right sinus exhibits  maxillary sinus tenderness. Left sinus exhibits maxillary sinus tenderness.  Mouth/Throat: Uvula is midline and mucous membranes are normal. Posterior oropharyngeal erythema present. No oropharyngeal exudate or posterior oropharyngeal edema.  Eyes: Pupils are equal, round, and reactive to light. Conjunctivae and EOM are normal. Right eye exhibits no discharge. Left eye exhibits no discharge. No scleral icterus.  Neck: Normal range of motion. Neck supple. No JVD present. No tracheal deviation present. No thyromegaly present.  Cardiovascular: Normal rate, regular rhythm, normal heart sounds and intact distal pulses. Exam reveals no gallop and no friction rub.  No murmur heard. Pulmonary/Chest: Breath sounds normal. No respiratory distress. He has no wheezes. He has no rales.  Abdominal: Soft. Bowel sounds are normal. He exhibits no mass. There is no hepatosplenomegaly. There is no tenderness. There is no rebound, no guarding and no CVA tenderness.  Musculoskeletal: Normal range of motion. He exhibits no edema or tenderness.  Lymphadenopathy:    He has no cervical adenopathy.  Neurological: He is alert and oriented to person, place, and time. He has normal strength and normal reflexes. No cranial nerve deficit.  Skin: Skin is warm. No rash noted.  Nursing note and vitals reviewed.   BP 130/70   Pulse 100   Temp 98.3 F (36.8 C)   Ht 5\' 11"  (1.803 m)   Wt 220 lb (99.8 kg)   BMI 30.68 kg/m   Assessment and Plan:  1. Acute maxillary sinusitis, recurrence not specified Start Augmentin - amoxicillin-clavulanate (AUGMENTIN) 875-125 MG tablet; Take 1 tablet by mouth 2 (two) times daily.  Dispense: 20 tablet; Refill: 0  2. Bronchitis Start cough med with Augmentin/ pick up Mucinex D otc - guaiFENesin-codeine (ROBITUSSIN AC) 100-10 MG/5ML syrup; Take 5 mLs by mouth 3 (three) times daily as needed for cough.  Dispense: 100 mL; Refill: 0   Dr. Elizabeth Sauer [facility]  05/08/2018

## 2018-09-30 ENCOUNTER — Encounter: Payer: 59 | Admitting: Family Medicine

## 2018-10-09 ENCOUNTER — Encounter: Payer: Self-pay | Admitting: Family Medicine

## 2018-10-09 ENCOUNTER — Ambulatory Visit (INDEPENDENT_AMBULATORY_CARE_PROVIDER_SITE_OTHER): Payer: 59 | Admitting: Family Medicine

## 2018-10-09 VITALS — BP 120/70 | HR 76 | Ht 71.0 in | Wt 223.0 lb

## 2018-10-09 DIAGNOSIS — Z1211 Encounter for screening for malignant neoplasm of colon: Secondary | ICD-10-CM | POA: Diagnosis not present

## 2018-10-09 DIAGNOSIS — Z0000 Encounter for general adult medical examination without abnormal findings: Secondary | ICD-10-CM

## 2018-10-09 DIAGNOSIS — E663 Overweight: Secondary | ICD-10-CM | POA: Diagnosis not present

## 2018-10-09 LAB — HEMOCCULT GUIAC POC 1CARD (OFFICE): Fecal Occult Blood, POC: NEGATIVE

## 2018-10-09 NOTE — Progress Notes (Signed)
Date:  10/09/2018   Name:  Brian Reeves   DOB:  1986-04-25   MRN:  [id number]   Chief Complaint: Annual Exam  Patient is a 33 year old male who presents for a comprehensive physical exam. The patient reports the following problems: none. Health maintenance has been reviewed up to date.   Review of Systems  Constitutional: Negative for appetite change, chills, fatigue, fever and unexpected weight change.  HENT: Negative for drooling, ear discharge, ear pain, facial swelling, hearing loss, nosebleeds, sneezing, sore throat and trouble swallowing.   Eyes: Negative for photophobia, pain, discharge, redness, itching and visual disturbance.  Respiratory: Negative for cough, choking, chest tightness, shortness of breath and wheezing.   Cardiovascular: Negative for chest pain, palpitations and leg swelling.  Gastrointestinal: Negative for abdominal pain, blood in stool, constipation, diarrhea, nausea, rectal pain and vomiting.  Endocrine: Negative for cold intolerance, heat intolerance, polydipsia, polyphagia and polyuria.  Genitourinary: Negative for decreased urine volume, difficulty urinating, discharge, dysuria, flank pain, frequency, hematuria, penile pain, penile swelling, scrotal swelling, testicular pain and urgency.  Musculoskeletal: Negative for back pain, joint swelling, myalgias, neck pain and neck stiffness.  Skin: Negative for color change and rash.  Allergic/Immunologic: Negative for environmental allergies and immunocompromised state.  Neurological: Negative for dizziness, tremors, seizures, syncope, speech difficulty, weakness, light-headedness, numbness and headaches.  Hematological: Does not bruise/bleed easily.  Psychiatric/Behavioral: Negative for agitation, behavioral problems, confusion, dysphoric mood, hallucinations, self-injury and suicidal ideas. The patient is not nervous/anxious.     Patient Active Problem List   Diagnosis Date Noted  . Blood in stool   .  Meckel diverticulum 12/20/2015  . Seasonal allergies 12/20/2015  . Exercise-induced asthma 12/20/2015    Allergies  Allergen Reactions  . Peanut-Containing Drug Products Anaphylaxis    (reaction to all nuts)    Past Surgical History:  Procedure Laterality Date  . arm fracture Left   . COLECTOMY  07/2013   open.  [place]  . COLONOSCOPY WITH PROPOFOL N/A 12/24/2015   Procedure: COLONOSCOPY WITH PROPOFOL;  Surgeon: Midge Minium, MD;  Location: [facility];  Service: Endoscopy;  Laterality: N/A;  LEAVE PT LAST  . INNER EAR SURGERY  as child   ruptured eardrum  . NASAL SEPTUM SURGERY  2015 and 2016  . TONSILLECTOMY  2015  . WISDOM TOOTH EXTRACTION  2015    Social History   Tobacco Use  . Smoking status: Former Smoker    Packs/day: 0.50    Years: 3.00    Pack years: 1.50    Types: Cigarettes  . Smokeless tobacco: Never Used  . Tobacco comment: quit approx 2009  Substance Use Topics  . Alcohol use: Yes    Alcohol/week: 6.0 standard drinks    Types: 3 Glasses of wine, 3 Shots of liquor per week    Comment:    . Drug use: No     Medication list has been reviewed and updated.  No outpatient medications have been marked as taking for the 10/09/18 encounter (Office Visit) with Duanne Limerick, MD.    [facility] 2/9 Scores 10/09/2018 09/28/2017 11/22/2015  PHQ - 2 Score 0 0 0  PHQ- 9 Score 0 0 -    Physical Exam Vitals signs and nursing note reviewed.  HENT:     Head: Normocephalic.     Jaw: There is normal jaw occlusion.     Right Ear: Hearing, tympanic membrane, ear canal and external ear normal.     Left  Ear: Hearing, tympanic membrane, ear canal and external ear normal.     Nose: Nose normal.     Right Turbinates: Not enlarged.     Left Turbinates: Not enlarged.     Mouth/Throat:     Lips: Pink.     Mouth: Mucous membranes are pale and dry.     Palate: No mass.     Pharynx: Oropharynx is clear. Uvula midline.  Eyes:     General: Lids are normal. No  scleral icterus.       Right eye: No discharge.        Left eye: No discharge.     Conjunctiva/sclera: Conjunctivae normal.     Pupils: Pupils are equal, round, and reactive to light.     Funduscopic exam:    Right eye: No AV nicking or arteriolar narrowing.        Left eye: No AV nicking or arteriolar narrowing.  Neck:     Musculoskeletal: Full passive range of motion without pain, normal range of motion and neck supple.     Thyroid: No thyroid mass, thyromegaly or thyroid tenderness.     Vascular: Normal carotid pulses. No carotid bruit, hepatojugular reflux or JVD.     Trachea: Trachea and phonation normal. No tracheal deviation.  Cardiovascular:     Rate and Rhythm: Normal rate and regular rhythm.  No extrasystoles are present.    Chest Wall: PMI is not displaced. No thrill.     Pulses:          Carotid pulses are 2+ on the right side and 2+ on the left side.      Radial pulses are 2+ on the right side and 2+ on the left side.       Femoral pulses are 2+ on the right side and 2+ on the left side.      Popliteal pulses are 2+ on the right side and 2+ on the left side.       Dorsalis pedis pulses are 2+ on the right side and 2+ on the left side.       Posterior tibial pulses are 2+ on the right side and 2+ on the left side.     Heart sounds: Normal heart sounds, S1 normal and S2 normal. Heart sounds not distant. No murmur. No systolic murmur. No diastolic murmur. No friction rub. No gallop. No S3 or S4 sounds.   Pulmonary:     Effort: Pulmonary effort is normal. No respiratory distress.     Breath sounds: Normal breath sounds. No decreased breath sounds, wheezing, rhonchi or rales.  Chest:     Chest wall: No mass.     Breasts: Breasts are symmetrical.        Right: Normal. No swelling, inverted nipple or nipple discharge.        Left: Normal. No swelling, inverted nipple or nipple discharge.  Abdominal:     General: Bowel sounds are normal.     Palpations: Abdomen is soft. There  is no shifting dullness, hepatomegaly, splenomegaly or mass.     Tenderness: There is no abdominal tenderness. There is no right CVA tenderness, left CVA tenderness, guarding or rebound.  Genitourinary:    Pubic Area: No rash.      Penis: Normal and circumcised.      Scrotum/Testes:        Right: Mass not present.        Left: Mass not present.     Epididymis:  Right: Normal.     Left: Normal.     Prostate: Normal.     Rectum: Normal. Guaiac result negative.  Musculoskeletal: Normal range of motion.        General: No tenderness.     Lumbar back: Normal.     Right lower leg: No edema.     Left lower leg: No edema.  Lymphadenopathy:     Head:     Right side of head: No submandibular adenopathy.     Left side of head: No submandibular adenopathy.     Cervical: No cervical adenopathy.     Right cervical: No superficial cervical adenopathy.    Left cervical: No superficial cervical adenopathy.     Upper Body:     Right upper body: No supraclavicular adenopathy.     Left upper body: No supraclavicular adenopathy.  Skin:    General: Skin is warm.     Capillary Refill: Capillary refill takes less than 2 seconds.     Findings: No rash.  Neurological:     Mental Status: He is alert and oriented to person, place, and time.     Cranial Nerves: No cranial nerve deficit.     Deep Tendon Reflexes: Reflexes are normal and symmetric.  Psychiatric:        Behavior: Behavior is cooperative.     BP 120/70   Pulse 76   Ht 5\' 11"  (1.803 m)   Wt 223 lb (101.2 kg)   BMI 31.10 kg/m   Assessment and Plan: 1. Annual physical exam No subjective objective concerns noted with the history and physical exam.  Review of patient's previous appointments, labs, imaging, and referrals notes no concerns.THANOS CAPAN is a 33 y.o. male who presents today for his Complete Annual Exam. He feels well. He reports exercising . He reports he is sleeping well.  - Comprehensive metabolic panel - Lipid  panel - CBC with Differential/Platelet  2. Overweight Mediterranean diet given.  3. Colon cancer screening 3 was performed and guaiac was negative. - POCT occult blood stool

## 2018-10-09 NOTE — Patient Instructions (Signed)
Mediterranean Diet  A Mediterranean diet refers to food and lifestyle choices that are based on the traditions of countries located on the [place]. This way of eating has been shown to help prevent certain conditions and improve outcomes for people who have chronic diseases, like kidney disease and heart disease.  What are tips for following this plan?  Lifestyle   Cook and eat meals together with your family, when possible.   Drink enough fluid to keep your urine clear or pale yellow.   Be physically active every day. This includes:  ? Aerobic exercise like running or swimming.  ? Leisure activities like gardening, walking, or housework.   Get 7-8 hours of sleep each night.   If recommended by your health care provider, drink red wine in moderation. This means 1 glass a day for nonpregnant women and 2 glasses a day for men. A glass of wine equals 5 oz (150 mL).  Reading food labels     Check the serving size of packaged foods. For foods such as rice and pasta, the serving size refers to the amount of cooked product, not dry.   Check the total fat in packaged foods. Avoid foods that have saturated fat or trans fats.   Check the ingredients list for added sugars, such as corn syrup.  Shopping   At the grocery store, buy most of your food from the areas near the walls of the store. This includes:  ? Fresh fruits and vegetables (produce).  ? Grains, beans, nuts, and seeds. Some of these may be available in unpackaged forms or large amounts (in bulk).  ? Fresh seafood.  ? Poultry and eggs.  ? Low-fat dairy products.   Buy whole ingredients instead of prepackaged foods.   Buy fresh fruits and vegetables in-season from local farmers markets.   Buy frozen fruits and vegetables in resealable bags.   If you do not have access to quality fresh seafood, buy precooked frozen shrimp or canned fish, such as tuna, salmon, or sardines.   Buy small amounts of raw or cooked vegetables, salads, or olives from  the deli or salad bar at your store.   Stock your pantry so you always have certain foods on hand, such as olive oil, canned tuna, canned tomatoes, rice, pasta, and beans.  Cooking   Cook foods with extra-virgin olive oil instead of using butter or other vegetable oils.   Have meat as a side dish, and have vegetables or grains as your main dish. This means having meat in small portions or adding small amounts of meat to foods like pasta or stew.   Use beans or vegetables instead of meat in common dishes like chili or lasagna.   Experiment with different cooking methods. Try roasting or broiling vegetables instead of steaming or sauteing them.   Add frozen vegetables to soups, stews, pasta, or rice.   Add nuts or seeds for added healthy fat at each meal. You can add these to yogurt, salads, or vegetable dishes.   Marinate fish or vegetables using olive oil, lemon juice, garlic, and fresh herbs.  Meal planning     Plan to eat 1 vegetarian meal one day each week. Try to work up to 2 vegetarian meals, if possible.   Eat seafood 2 or more times a week.   Have healthy snacks readily available, such as:  ? Vegetable sticks with hummus.  ? Greek yogurt.  ? Fruit and nut trail mix.   Eat balanced   meals throughout the week. This includes:  ? Fruit: 2-3 servings a day  ? Vegetables: 4-5 servings a day  ? Low-fat dairy: 2 servings a day  ? Fish, poultry, or lean meat: 1 serving a day  ? Beans and legumes: 2 or more servings a week  ? Nuts and seeds: 1-2 servings a day  ? Whole grains: 6-8 servings a day  ? Extra-virgin olive oil: 3-4 servings a day   Limit red meat and sweets to only a few servings a month  What are my food choices?   Mediterranean diet  ? Recommended  ? Grains: Whole-grain pasta. Brown rice. Bulgar wheat. Polenta. Couscous. Whole-wheat bread. Oatmeal. Quinoa.  ? Vegetables: Artichokes. Beets. Broccoli. Cabbage. Carrots. Eggplant. Green beans. Chard. Kale. Spinach. Onions. Leeks. Peas. Squash.  Tomatoes. Peppers. Radishes.  ? Fruits: Apples. Apricots. Avocado. Berries. Bananas. Cherries. Dates. Figs. Grapes. Lemons. Melon. Oranges. Peaches. Plums. Pomegranate.  ? Meats and other protein foods: Beans. Almonds. Sunflower seeds. Pine nuts. Peanuts. Cod. Salmon. Scallops. Shrimp. Tuna. Tilapia. Clams. Oysters. Eggs.  ? Dairy: Low-fat milk. Cheese. Greek yogurt.  ? Beverages: Water. Red wine. Herbal tea.  ? Fats and oils: Extra virgin olive oil. Avocado oil. Grape seed oil.  ? Sweets and desserts: Greek yogurt with honey. Baked apples. Poached pears. Trail mix.  ? Seasoning and other foods: Basil. Cilantro. Coriander. Cumin. Mint. Parsley. Sage. Rosemary. Tarragon. Garlic. Oregano. Thyme. Pepper. Balsalmic vinegar. Tahini. Hummus. Tomato sauce. Olives. Mushrooms.  ? Limit these  ? Grains: Prepackaged pasta or rice dishes. Prepackaged cereal with added sugar.  ? Vegetables: Deep fried potatoes (french fries).  ? Fruits: Fruit canned in syrup.  ? Meats and other protein foods: Beef. Pork. Lamb. Poultry with skin. Hot dogs. Bacon.  ? Dairy: Ice cream. Sour cream. Whole milk.  ? Beverages: Juice. Sugar-sweetened soft drinks. Beer. Liquor and spirits.  ? Fats and oils: Butter. Canola oil. Vegetable oil. Beef fat (tallow). Lard.  ? Sweets and desserts: Cookies. Cakes. Pies. Candy.  ? Seasoning and other foods: Mayonnaise. Premade sauces and marinades.  ? The items listed may not be a complete list. Talk with your dietitian about what dietary choices are right for you.  Summary   The Mediterranean diet includes both food and lifestyle choices.   Eat a variety of fresh fruits and vegetables, beans, nuts, seeds, and whole grains.   Limit the amount of red meat and sweets that you eat.   Talk with your health care provider about whether it is safe for you to drink red wine in moderation. This means 1 glass a day for nonpregnant women and 2 glasses a day for men. A glass of wine equals 5 oz (150 mL).  This information  is not intended to replace advice given to you by your health care provider. Make sure you discuss any questions you have with your health care provider.  Document Released: 03/23/2016 Document Revised: 04/25/2016 Document Reviewed: 03/23/2016  Elsevier Interactive Patient Education  2019 [employer]

## 2018-10-10 LAB — CBC WITH DIFFERENTIAL/PLATELET
BASOS ABS: 0 10*3/uL (ref 0.0–0.2)
Basos: 1 %
EOS (ABSOLUTE): 0.5 10*3/uL — ABNORMAL HIGH (ref 0.0–0.4)
Eos: 7 %
HEMOGLOBIN: 15.9 g/dL (ref 13.0–17.7)
Hematocrit: 46.5 % (ref 37.5–51.0)
IMMATURE GRANS (ABS): 0 10*3/uL (ref 0.0–0.1)
IMMATURE GRANULOCYTES: 1 %
LYMPHS: 33 %
Lymphocytes Absolute: 2.2 10*3/uL (ref 0.7–3.1)
MCH: 29.1 pg (ref 26.6–33.0)
MCHC: 34.2 g/dL (ref 31.5–35.7)
MCV: 85 fL (ref 79–97)
MONOCYTES: 7 %
Monocytes Absolute: 0.5 10*3/uL (ref 0.1–0.9)
Neutrophils Absolute: 3.4 10*3/uL (ref 1.4–7.0)
Neutrophils: 51 %
Platelets: 255 10*3/uL (ref 150–450)
RBC: 5.47 x10E6/uL (ref 4.14–5.80)
RDW: 13 % (ref 11.6–15.4)
WBC: 6.5 10*3/uL (ref 3.4–10.8)

## 2018-10-10 LAB — COMPREHENSIVE METABOLIC PANEL
A/G RATIO: 2 (ref 1.2–2.2)
ALT: 28 IU/L (ref 0–44)
AST: 18 IU/L (ref 0–40)
Albumin: 4.7 g/dL (ref 4.0–5.0)
Alkaline Phosphatase: 87 IU/L (ref 39–117)
BILIRUBIN TOTAL: 0.5 mg/dL (ref 0.0–1.2)
BUN/Creatinine Ratio: 15 (ref 9–20)
BUN: 15 mg/dL (ref 6–20)
CALCIUM: 9.7 mg/dL (ref 8.7–10.2)
CO2: 24 mmol/L (ref 20–29)
Chloride: 103 mmol/L (ref 96–106)
Creatinine, Ser: 1.03 mg/dL (ref 0.76–1.27)
GFR, EST AFRICAN AMERICAN: 109 mL/min/{1.73_m2} (ref >59)
GFR, EST NON AFRICAN AMERICAN: 94 mL/min/{1.73_m2} (ref >59)
GLOBULIN, TOTAL: 2.4 g/dL (ref 1.5–4.5)
Glucose: 86 mg/dL (ref 65–99)
POTASSIUM: 4.8 mmol/L (ref 3.5–5.2)
SODIUM: 141 mmol/L (ref 134–144)
Total Protein: 7.1 g/dL (ref 6.0–8.5)

## 2018-10-10 LAB — LIPID PANEL
CHOL/HDL RATIO: 5.9 ratio — AB (ref 0.0–5.0)
Cholesterol, Total: 205 mg/dL — ABNORMAL HIGH (ref 100–199)
HDL: 35 mg/dL — ABNORMAL LOW (ref >39)
LDL Calculated: 149 mg/dL — ABNORMAL HIGH (ref 0–99)
Triglycerides: 107 mg/dL (ref 0–149)
VLDL Cholesterol Cal: 21 mg/dL (ref 5–40)

## 2018-10-28 ENCOUNTER — Other Ambulatory Visit: Payer: Self-pay

## 2018-10-28 ENCOUNTER — Encounter: Payer: Self-pay | Admitting: Family Medicine

## 2018-10-28 ENCOUNTER — Ambulatory Visit: Payer: 59 | Admitting: Family Medicine

## 2018-10-28 ENCOUNTER — Other Ambulatory Visit
Admission: RE | Admit: 2018-10-28 | Discharge: 2018-10-28 | Disposition: A | Discharge status: Home / Self Care | Payer: 59 | Attending: Family Medicine | Admitting: Family Medicine

## 2018-10-28 VITALS — BP 120/90 | HR 104 | Temp 99.1°F | Ht 71.0 in | Wt 213.0 lb

## 2018-10-28 DIAGNOSIS — J0100 Acute maxillary sinusitis, unspecified: Secondary | ICD-10-CM | POA: Diagnosis not present

## 2018-10-28 DIAGNOSIS — R05 Cough: Secondary | ICD-10-CM | POA: Insufficient documentation

## 2018-10-28 DIAGNOSIS — R509 Fever, unspecified: Secondary | ICD-10-CM | POA: Insufficient documentation

## 2018-10-28 LAB — CBC WITH DIFFERENTIAL/PLATELET
Abs Immature Granulocytes: 0 10*3/uL (ref 0.00–0.07)
Basophils Absolute: 0 10*3/uL (ref 0.0–0.1)
Basophils Relative: 0 %
Eosinophils Absolute: 0.1 10*3/uL (ref 0.0–0.5)
Eosinophils Relative: 3 %
HCT: 44.8 % (ref 39.0–52.0)
HEMOGLOBIN: 15.4 g/dL (ref 13.0–17.0)
Immature Granulocytes: 0 %
Lymphocytes Relative: 47 %
Lymphs Abs: 1.7 10*3/uL (ref 0.7–4.0)
MCH: 29.4 pg (ref 26.0–34.0)
MCHC: 34.4 g/dL (ref 30.0–36.0)
MCV: 85.7 fL (ref 80.0–100.0)
MONO ABS: 0.5 10*3/uL (ref 0.1–1.0)
Monocytes Relative: 14 %
Neutro Abs: 1.3 10*3/uL — ABNORMAL LOW (ref 1.7–7.7)
Neutrophils Relative %: 36 %
Platelets: 180 10*3/uL (ref 150–400)
RBC: 5.23 MIL/uL (ref 4.22–5.81)
RDW: 12.8 % (ref 11.5–15.5)
WBC: 3.7 10*3/uL — AB (ref 4.0–10.5)
nRBC: 0 % (ref 0.0–0.2)

## 2018-10-28 LAB — POCT RAPID STREP A (OFFICE): Rapid Strep A Screen: NEGATIVE

## 2018-10-28 LAB — POCT INFLUENZA A/B
Influenza A, POC: NEGATIVE
Influenza B, POC: NEGATIVE

## 2018-10-28 MED ORDER — AMOXICILLIN 500 MG PO CAPS: 500.0000 mg | ORAL_CAPSULE | Freq: Three times a day (TID) | ORAL | 0 refills | Status: DC

## 2018-10-28 NOTE — Progress Notes (Signed)
Date:  10/28/2018   Name:  Brian Reeves   DOB:  Sep 24, 1985   MRN:  [id number]   Chief Complaint: Sinusitis (started 4 days ago with scratchy throat and cough. Started with fever on Friday/ 102.8- has had fever off and on since. Been taking mucinex and Ibuprofen)  Sinusitis  This is a new problem. The current episode started in the past 7 days (Thursday). The problem is unchanged. The maximum temperature recorded prior to his arrival was 102 - 102.9 F. The fever has been present for less than 1 day. His pain is at a severity of 0/10. He is experiencing no pain. Associated symptoms include chills, congestion, coughing, diaphoresis and a sore throat. Pertinent negatives include no ear pain, headaches, hoarse voice, neck pain, shortness of breath, sinus pressure, sneezing or swollen glands. (Sore throat /not now) Past treatments include acetaminophen (NSAID). The treatment provided mild relief.    Review of Systems  Constitutional: Positive for chills, diaphoresis and fever.  HENT: Positive for congestion and sore throat. Negative for drooling, ear discharge, ear pain, hoarse voice, sinus pressure and sneezing.   Eyes: Negative for pain and redness.  Respiratory: Positive for cough. Negative for shortness of breath, wheezing and stridor.   Cardiovascular: Negative for chest pain, palpitations and leg swelling.  Gastrointestinal: Negative for abdominal pain, blood in stool, constipation, diarrhea and nausea.  Endocrine: Negative for polydipsia.  Genitourinary: Negative for dysuria, frequency, hematuria and urgency.  Musculoskeletal: Negative for back pain, myalgias and neck pain.  Skin: Negative for rash.  Allergic/Immunologic: Negative for environmental allergies.  Neurological: Negative for dizziness and headaches.  Hematological: Does not bruise/bleed easily.  Psychiatric/Behavioral: Negative for suicidal ideas. The patient is not nervous/anxious.     Patient Active Problem List   Diagnosis Date Noted  . Blood in stool   . Meckel diverticulum 12/20/2015  . Seasonal allergies 12/20/2015  . Exercise-induced asthma 12/20/2015    Allergies  Allergen Reactions  . Peanut-Containing Drug Products Anaphylaxis    (reaction to all nuts)    Past Surgical History:  Procedure Laterality Date  . arm fracture Left   . COLECTOMY  07/2013   open.  [place]  . COLONOSCOPY WITH PROPOFOL N/A 12/24/2015   Procedure: COLONOSCOPY WITH PROPOFOL;  Surgeon: Midge Minium, MD;  Location: [facility];  Service: Endoscopy;  Laterality: N/A;  LEAVE PT LAST  . INNER EAR SURGERY  as child   ruptured eardrum  . NASAL SEPTUM SURGERY  2015 and 2016  . TONSILLECTOMY  2015  . WISDOM TOOTH EXTRACTION  2015    Social History   Tobacco Use  . Smoking status: Former Smoker    Packs/day: 0.50    Years: 3.00    Pack years: 1.50    Types: Cigarettes  . Smokeless tobacco: Never Used  . Tobacco comment: quit approx 2009  Substance Use Topics  . Alcohol use: Yes    Alcohol/week: 6.0 standard drinks    Types: 3 Glasses of wine, 3 Shots of liquor per week    Comment:    . Drug use: No     Medication list has been reviewed and updated.  No outpatient medications have been marked as taking for the 10/28/18 encounter (Office Visit) with Duanne Limerick, MD.    [facility] 2/9 Scores 10/09/2018 09/28/2017 11/22/2015  PHQ - 2 Score 0 0 0  PHQ- 9 Score 0 0 -    Physical Exam Vitals signs and nursing note reviewed.  HENT:     Head: Normocephalic.     Right Ear: Tympanic membrane, ear canal and external ear normal.     Left Ear: Tympanic membrane, ear canal and external ear normal.     Nose: Nose normal. No congestion or rhinorrhea.     Right Turbinates: Not enlarged or swollen.     Left Turbinates: Not enlarged or swollen.     Right Sinus: No maxillary sinus tenderness or frontal sinus tenderness.     Left Sinus: No maxillary sinus tenderness or frontal sinus tenderness.      Mouth/Throat:     Mouth: Mucous membranes are moist.     Pharynx: No pharyngeal swelling, oropharyngeal exudate or posterior oropharyngeal erythema.     Tonsils: No tonsillar exudate or tonsillar abscesses.  Eyes:     General: No scleral icterus.       Right eye: No discharge.        Left eye: No discharge.     Conjunctiva/sclera: Conjunctivae normal.     Pupils: Pupils are equal, round, and reactive to light.  Neck:     Musculoskeletal: Full passive range of motion without pain, normal range of motion and neck supple. No neck rigidity or muscular tenderness.     Thyroid: No thyroid mass, thyromegaly or thyroid tenderness.     Vascular: No carotid bruit or JVD.     Trachea: No tracheal deviation.  Cardiovascular:     Rate and Rhythm: Normal rate and regular rhythm.     Pulses: Normal pulses.     Heart sounds: Normal heart sounds. No murmur. No friction rub. No gallop.   Pulmonary:     Effort: No respiratory distress.     Breath sounds: Normal breath sounds. No stridor. No decreased breath sounds, wheezing, rhonchi or rales.    Chest:     Chest wall: No tenderness.  Abdominal:     General: Bowel sounds are normal.     Palpations: Abdomen is soft. There is no mass.     Tenderness: There is no abdominal tenderness. There is no guarding or rebound.  Musculoskeletal: Normal range of motion.        General: No tenderness.  Lymphadenopathy:     Cervical: No cervical adenopathy.     Right cervical: No superficial, deep or posterior cervical adenopathy.    Left cervical: No superficial, deep or posterior cervical adenopathy.  Skin:    General: Skin is warm.     Capillary Refill: Capillary refill takes less than 2 seconds.     Findings: No bruising, erythema or rash.  Neurological:     Mental Status: He is alert and oriented to person, place, and time.     Cranial Nerves: No cranial nerve deficit.     Deep Tendon Reflexes: Reflexes are normal and symmetric.     Wt Readings from  Last 3 Encounters:  10/28/18 213 lb (96.6 kg)  10/09/18 223 lb (101.2 kg)  05/08/18 220 lb (99.8 kg)    BP 120/90   Pulse (!) 104   Temp 99.1 F (37.3 C) (Oral)   Ht  (1.803 m)   Wt 213 lb (96.6 kg)   SpO2 95%   BMI 29.71 kg/m   Assessment and Plan: 1. Fever and chills Patient with onset of fever and chills as well as cough but without shortness of breath over the course of the last 48 to 72 hours.  Algorithm of evaluation as follows: Following to a be performed and  was noted to be negative, rapid strep a performed noted to be negative, and the CBC with normal white count and no shift.  M is possibly consistent for a new sinusitis - POCT Influenza A/B - POCT rapid strep A  2. Subacute maxillary sinusitis With test above is noted and patient remains symptomatic and low-grade febrile will initiate amoxicillin 500 mg 3 times a day for 10 days.  Hold on Tamiflu at this time is been greater than 48 hours. - amoxicillin (AMOXIL) 500 MG capsule; Take 1 capsule (500 mg total) by mouth 3 (three) times daily.  Dispense: 30 capsule; Refill: 0

## 2019-07-25 ENCOUNTER — Encounter: Payer: Self-pay | Admitting: Family Medicine

## 2019-07-25 ENCOUNTER — Other Ambulatory Visit: Payer: Self-pay

## 2019-07-25 ENCOUNTER — Ambulatory Visit: Payer: 59 | Admitting: Family Medicine

## 2019-07-25 VITALS — BP 122/62 | Ht 71.0 in | Wt 221.0 lb

## 2019-07-25 DIAGNOSIS — M47812 Spondylosis without myelopathy or radiculopathy, cervical region: Secondary | ICD-10-CM

## 2019-07-25 DIAGNOSIS — M79622 Pain in left upper arm: Secondary | ICD-10-CM

## 2019-07-25 DIAGNOSIS — M5412 Radiculopathy, cervical region: Secondary | ICD-10-CM

## 2019-07-25 MED ORDER — MELOXICAM 15 MG PO TABS: 15.0000 mg | ORAL_TABLET | Freq: Every day | ORAL | 1 refills | Status: DC

## 2019-07-25 NOTE — Progress Notes (Signed)
Date:  07/25/2019   Name:  Brian Reeves   DOB:  16-Sep-1985   MRN:  [id number]   Chief Complaint: Shoulder Pain (Left shoulder pain-2 months atleast. )  Shoulder Pain  The pain is present in the left shoulder (left scapula). This is a new problem. The current episode started 1 to 4 weeks ago. The problem occurs constantly. The problem has been waxing and waning. The quality of the pain is described as aching. Pertinent negatives include no fever, inability to bear weight, joint locking, joint swelling, limited range of motion or numbness.  Neck Pain  This is a new problem. The current episode started 1 to 4 weeks ago. The problem occurs constantly. The problem has been waxing and waning. The pain is associated with nothing. The quality of the pain is described as aching. Pertinent negatives include no chest pain, fever, headaches or numbness.    Lab Results  Component Value Date   CREATININE 1.03 10/09/2018   BUN 15 10/09/2018   NA 141 10/09/2018   K 4.8 10/09/2018   CL 103 10/09/2018   CO2 24 10/09/2018   Lab Results  Component Value Date   CHOL 205 (H) 10/09/2018   HDL 35 (L) 10/09/2018   LDLCALC 149 (H) 10/09/2018   TRIG 107 10/09/2018   CHOLHDL 5.9 (H) 10/09/2018   No results found for: TSH No results found for: HGBA1C   Review of Systems  Constitutional: Negative for chills and fever.  HENT: Negative for drooling, ear discharge, ear pain and sore throat.   Respiratory: Negative for cough, shortness of breath and wheezing.   Cardiovascular: Negative for chest pain, palpitations and leg swelling.  Gastrointestinal: Negative for abdominal pain, blood in stool, constipation, diarrhea and nausea.  Endocrine: Negative for polydipsia.  Genitourinary: Negative for dysuria, frequency, hematuria and urgency.  Musculoskeletal: Positive for neck pain. Negative for back pain and myalgias.  Skin: Negative for rash.  Allergic/Immunologic: Negative for environmental allergies.   Neurological: Negative for dizziness, numbness and headaches.  Hematological: Does not bruise/bleed easily.  Psychiatric/Behavioral: Negative for suicidal ideas. The patient is not nervous/anxious.     Patient Active Problem List   Diagnosis Date Noted  . Blood in stool   . Meckel diverticulum 12/20/2015  . Seasonal allergies 12/20/2015  . Exercise-induced asthma 12/20/2015    Allergies  Allergen Reactions  . Peanut-Containing Drug Products Anaphylaxis    (reaction to all nuts)    Past Surgical History:  Procedure Laterality Date  . arm fracture Left   . COLECTOMY  07/2013   open.  [place]  . COLONOSCOPY WITH PROPOFOL N/A 12/24/2015   Procedure: COLONOSCOPY WITH PROPOFOL;  Surgeon: Lucilla Lame, MD;  Location: [facility];  Service: Endoscopy;  Laterality: N/A;  LEAVE PT LAST  . INNER EAR SURGERY  as child   ruptured eardrum  . NASAL SEPTUM SURGERY  2015 and 2016  . TONSILLECTOMY  2015  . WISDOM TOOTH EXTRACTION  2015    Social History   Tobacco Use  . Smoking status: Former Smoker    Packs/day: 0.50    Years: 3.00    Pack years: 1.50    Types: Cigarettes  . Smokeless tobacco: Never Used  . Tobacco comment: quit approx 2009  Substance Use Topics  . Alcohol use: Yes    Alcohol/week: 6.0 standard drinks    Types: 3 Glasses of wine, 3 Shots of liquor per week    Comment:    . Drug  use: No     Medication list has been reviewed and updated.  No outpatient medications have been marked as taking for the 07/25/19 encounter (Office Visit) with Duanne Limerick, MD.    [facility] 2/9 Scores 07/25/2019 10/09/2018 09/28/2017 11/22/2015  PHQ - 2 Score 0 0 0 0  PHQ- 9 Score - 0 0 -    BP Readings from Last 3 Encounters:  07/25/19 122/62  10/28/18 120/90  10/09/18 120/70    Physical Exam HENT:     Head: Normocephalic.  Neck:     Thyroid: No thyroid mass.  Cardiovascular:     Rate and Rhythm: Normal rate.     Heart sounds: S1 normal. No murmur. No  systolic murmur. No diastolic murmur. No friction rub. No gallop. No S3 or S4 sounds.   Pulmonary:     Breath sounds: Normal breath sounds. No decreased breath sounds, wheezing or rhonchi.  Chest:     Breasts:        Left: Normal. No swelling or mass.  Musculoskeletal:     Cervical back: Normal range of motion. No rigidity or tenderness.  Lymphadenopathy:     Head:     Right side of head: No submandibular or tonsillar adenopathy.     Left side of head: No submandibular or tonsillar adenopathy.     Cervical: No cervical adenopathy.     Right cervical: No superficial, deep or posterior cervical adenopathy.    Left cervical: No superficial, deep or posterior cervical adenopathy.     Upper Body:     Left upper body: No supraclavicular or axillary adenopathy.  Neurological:     General: No focal deficit present.     Motor: Motor function is intact.     Wt Readings from Last 3 Encounters:  07/25/19 221 lb (100.2 kg)  10/28/18 213 lb (96.6 kg)  10/09/18 223 lb (101.2 kg)    BP 122/62 (BP Location: Left Arm, Patient Position: Sitting, Cuff Size: Large)   Ht 5\' 11"  (1.803 m)   Wt 221 lb (100.2 kg)   BMI 30.82 kg/m   Assessment and Plan: 1. Cervical arthritis New onset.  Recent exacerbation perhaps status post plan of cough.  No neurologic deficit.  Will initiate meloxicam 15 mg once a day.  Patient is to call if continued pain with next step with x-ray of the neck and possible axillary ultrasound. - meloxicam (MOBIC) 15 MG tablet; Take 1 tablet (15 mg total) by mouth daily.  Dispense: 30 tablet; Refill: 1  2. Cervical radiculopathy Chronic.  Controlled.  Stable.  As noted above we will initiate meloxicam 15 mg once a day.  Patient is to return should pain continue or return call next week. - meloxicam (MOBIC) 15 MG tablet; Take 1 tablet (15 mg total) by mouth daily.  Dispense: 30 tablet; Refill: 1

## 2019-07-30 NOTE — Addendum Note (Signed)
Addended by: Fredderick Severance on: 07/30/2019 04:05 PM   Modules accepted: Orders

## 2019-07-31 ENCOUNTER — Other Ambulatory Visit: Payer: Self-pay | Admitting: Family Medicine

## 2019-07-31 DIAGNOSIS — M79622 Pain in left upper arm: Secondary | ICD-10-CM

## 2019-08-01 ENCOUNTER — Ambulatory Visit
Admission: RE | Admit: 2019-08-01 | Discharge: 2019-08-01 | Disposition: A | Discharge status: Home / Self Care | Payer: 59 | Source: Ambulatory Visit | Attending: Family Medicine | Admitting: Family Medicine

## 2019-08-01 ENCOUNTER — Other Ambulatory Visit: Payer: Self-pay

## 2019-08-01 DIAGNOSIS — M79622 Pain in left upper arm: Secondary | ICD-10-CM | POA: Insufficient documentation

## 2019-09-08 ENCOUNTER — Encounter: Payer: Self-pay | Admitting: Family Medicine

## 2019-09-08 ENCOUNTER — Ambulatory Visit: Payer: 59 | Admitting: Family Medicine

## 2019-09-08 ENCOUNTER — Ambulatory Visit
Admission: RE | Admit: 2019-09-08 | Discharge: 2019-09-08 | Disposition: A | Discharge status: Home / Self Care | Payer: 59 | Source: Ambulatory Visit | Attending: Family Medicine | Admitting: Family Medicine

## 2019-09-08 ENCOUNTER — Other Ambulatory Visit: Payer: Self-pay

## 2019-09-08 ENCOUNTER — Other Ambulatory Visit
Admission: RE | Admit: 2019-09-08 | Discharge: 2019-09-08 | Disposition: A | Discharge status: Home / Self Care | Payer: 59 | Source: Home / Self Care | Attending: Family Medicine | Admitting: Family Medicine

## 2019-09-08 VITALS — BP 120/76 | HR 76 | Ht 71.0 in | Wt 220.0 lb

## 2019-09-08 DIAGNOSIS — M5412 Radiculopathy, cervical region: Secondary | ICD-10-CM | POA: Insufficient documentation

## 2019-09-08 DIAGNOSIS — M5090 Cervical disc disorder, unspecified, unspecified cervical region: Secondary | ICD-10-CM

## 2019-09-08 NOTE — Progress Notes (Signed)
Date:  09/08/2019   Name:  Brian Reeves   DOB:  1986-03-12   MRN:  [id number]   Chief Complaint: Shoulder Pain (Was taking Meloxicam at night for Shoulder pain- was helping, but has been out of med x 3 days- )  Neck Pain  This is a new problem. The current episode started more than 1 month ago. The problem occurs constantly. The problem has been waxing and waning. The pain is associated with nothing. The pain is present in the left side. The quality of the pain is described as aching. The pain is at a severity of 6/10. The pain is moderate. The symptoms are aggravated by twisting, sneezing, coughing, bending and position (turning head to left). The pain is worse during the day. Associated symptoms include numbness and tingling. Pertinent negatives include no chest pain, fever, headaches, leg pain, pain with swallowing, paresis, photophobia, syncope, trouble swallowing, visual change, weakness or weight loss. He has tried NSAIDs for the symptoms. The treatment provided moderate relief.    Lab Results  Component Value Date   CREATININE 1.03 10/09/2018   BUN 15 10/09/2018   NA 141 10/09/2018   K 4.8 10/09/2018   CL 103 10/09/2018   CO2 24 10/09/2018   Lab Results  Component Value Date   CHOL 205 (H) 10/09/2018   HDL 35 (L) 10/09/2018   LDLCALC 149 (H) 10/09/2018   TRIG 107 10/09/2018   CHOLHDL 5.9 (H) 10/09/2018   No results found for: TSH No results found for: HGBA1C   Review of Systems  Constitutional: Negative for chills, fever and weight loss.  HENT: Negative for drooling, ear discharge, ear pain, sore throat and trouble swallowing.   Eyes: Negative for photophobia.  Respiratory: Negative for cough, shortness of breath and wheezing.   Cardiovascular: Negative for chest pain, palpitations, leg swelling and syncope.  Gastrointestinal: Negative for abdominal pain, blood in stool, constipation, diarrhea and nausea.  Endocrine: Negative for polydipsia.  Genitourinary:  Negative for dysuria, frequency, hematuria and urgency.  Musculoskeletal: Positive for neck pain and neck stiffness. Negative for back pain and myalgias.  Skin: Negative for rash.  Allergic/Immunologic: Negative for environmental allergies.  Neurological: Positive for tingling and numbness. Negative for dizziness, weakness and headaches.  Hematological: Does not bruise/bleed easily.  Psychiatric/Behavioral: Negative for suicidal ideas. The patient is not nervous/anxious.     Patient Active Problem List   Diagnosis Date Noted  . Blood in stool   . Meckel diverticulum 12/20/2015  . Seasonal allergies 12/20/2015  . Exercise-induced asthma 12/20/2015    Allergies  Allergen Reactions  . Peanut-Containing Drug Products Anaphylaxis    (reaction to all nuts)    Past Surgical History:  Procedure Laterality Date  . arm fracture Left   . COLECTOMY  07/2013   open.  [place]  . COLONOSCOPY WITH PROPOFOL N/A 12/24/2015   Procedure: COLONOSCOPY WITH PROPOFOL;  Surgeon: Midge Minium, MD;  Location: [facility];  Service: Endoscopy;  Laterality: N/A;  LEAVE PT LAST  . INNER EAR SURGERY  as child   ruptured eardrum  . NASAL SEPTUM SURGERY  2015 and 2016  . TONSILLECTOMY  2015  . WISDOM TOOTH EXTRACTION  2015    Social History   Tobacco Use  . Smoking status: Former Smoker    Packs/day: 0.50    Years: 3.00    Pack years: 1.50    Types: Cigarettes  . Smokeless tobacco: Never Used  . Tobacco comment: quit approx 2009  Substance Use Topics  . Alcohol use: Yes    Alcohol/week: 6.0 standard drinks    Types: 3 Glasses of wine, 3 Shots of liquor per week    Comment:    . Drug use: No     Medication list has been reviewed and updated.  Current Meds  Medication Sig  . meloxicam (MOBIC) 15 MG tablet Take 1 tablet (15 mg total) by mouth daily.    PHQ 2/9 Scores 07/25/2019 10/09/2018 09/28/2017 11/22/2015  PHQ - 2 Score 0 0 0 0  PHQ- 9 Score - 0 0 -    BP Readings from  Last 3 Encounters:  09/08/19 120/76  07/25/19 122/62  10/28/18 120/90    Physical Exam Vitals and nursing note reviewed.  HENT:     Head: Normocephalic.     Right Ear: Tympanic membrane, ear canal and external ear normal.     Left Ear: Tympanic membrane, ear canal and external ear normal.     Nose: Nose normal.  Eyes:     General: No scleral icterus.       Right eye: No discharge.        Left eye: No discharge.     Conjunctiva/sclera: Conjunctivae normal.     Pupils: Pupils are equal, round, and reactive to light.  Neck:     Thyroid: No thyromegaly.     Vascular: No JVD.     Trachea: No tracheal deviation.  Cardiovascular:     Rate and Rhythm: Normal rate and regular rhythm.     Heart sounds: Normal heart sounds. No murmur. No friction rub. No gallop.   Pulmonary:     Effort: No respiratory distress.     Breath sounds: Normal breath sounds. No wheezing or rales.  Abdominal:     General: Bowel sounds are normal.     Palpations: Abdomen is soft. There is no mass.     Tenderness: There is no abdominal tenderness. There is no guarding or rebound.  Musculoskeletal:        General: No tenderness. Normal range of motion.     Cervical back: Normal range of motion and neck supple. Spasms present. No tenderness or bony tenderness. Pain with movement present.  Lymphadenopathy:     Cervical: No cervical adenopathy.  Skin:    General: Skin is warm.     Findings: No rash.  Neurological:     Mental Status: He is alert and oriented to person, place, and time.     Cranial Nerves: No cranial nerve deficit.     Sensory: Sensation is intact.     Motor: Motor function is intact.     Deep Tendon Reflexes: Reflexes are normal and symmetric.     Reflex Scores:      Tricep reflexes are 2+ on the right side and 2+ on the left side.      Bicep reflexes are 2+ on the right side and 2+ on the left side.      Brachioradialis reflexes are 2+ on the right side and 2+ on the left side.       Patellar reflexes are 2+ on the right side and 2+ on the left side.      Achilles reflexes are 2+ on the right side and 2+ on the left side.    Wt Readings from Last 3 Encounters:  09/08/19 220 lb (99.8 kg)  07/25/19 221 lb (100.2 kg)  10/28/18 213 lb (96.6 kg)    BP 120/76   Pulse  76   Ht 5\' 11"  (1.803 m)   Wt 220 lb (99.8 kg)   BMI 30.68 kg/m   Assessment and Plan:  1. Cervical disc disease New onset.  Persistent.  Uncontrolled.  Patient continues to have cervical pain that radiates to the left shoulder and down the left arm in the median nerve distribution.  Patient will be referred to orthopedic surgery to see Dr. Sherren Mocha Monday for evaluation and treatment.  Will obtain a C-spine x-ray for evaluation. - Ambulatory referral to Orthopedic Surgery - DG Cervical Spine Complete; Future  2. Cervical radiculopathy Patient has cervical pain with pain radiating to the left shoulder with flexion and turning head to the left.  This is suggestive of a cervical disc with radiculopathy and patient has been referred for evaluation and treatment.  Patient will continue meloxicam 15 mg once a day. - Ambulatory referral to Orthopedic Surgery - DG Cervical Spine Complete; Future

## 2019-09-08 NOTE — Patient Instructions (Addendum)
Why follow it? Research shows. . Those who follow the Mediterranean diet have a reduced risk of heart disease  . The diet is associated with a reduced incidence of Parkinson's and Alzheimer's diseases . People following the diet may have longer life expectancies and lower rates of chronic diseases  . The Dietary Guidelines for Americans recommends the Mediterranean diet as an eating plan to promote health and prevent disease  What Is the Mediterranean Diet?  . Healthy eating plan based on typical foods and recipes of Mediterranean-style cooking . The diet is primarily a plant based diet; these foods should make up a majority of meals   Starches - Plant based foods should make up a majority of meals - They are an important sources of vitamins, minerals, energy, antioxidants, and fiber - Choose whole grains, foods high in fiber and minimally processed items  - Typical grain sources include wheat, oats, barley, corn, brown rice, bulgar, farro, millet, polenta, couscous  - Various types of beans include chickpeas, lentils, fava beans, black beans, white beans   Fruits  Veggies - Large quantities of antioxidant rich fruits & veggies; 6 or more servings  - Vegetables can be eaten raw or lightly drizzled with oil and cooked  - Vegetables common to the traditional Mediterranean Diet include: artichokes, arugula, beets, broccoli, brussel sprouts, cabbage, carrots, celery, collard greens, cucumbers, eggplant, kale, leeks, lemons, lettuce, mushrooms, okra, onions, peas, peppers, potatoes, pumpkin, radishes, rutabaga, shallots, spinach, sweet potatoes, turnips, zucchini - Fruits common to the Mediterranean Diet include: apples, apricots, avocados, cherries, clementines, dates, figs, grapefruits, grapes, melons, nectarines, oranges, peaches, pears, pomegranates, strawberries, tangerines  Fats - Replace butter and margarine with healthy oils, such as olive oil, canola oil, and tahini  - Limit nuts to no  more than a handful a day  - Nuts include walnuts, almonds, pecans, pistachios, pine nuts  - Limit or avoid candied, honey roasted or heavily salted nuts - Olives are central to the Mediterranean diet - can be eaten whole or used in a variety of dishes   Meats Protein - Limiting red meat: no more than a few times a month - When eating red meat: choose lean cuts and keep the portion to the size of deck of cards - Eggs: approx. 0 to 4 times a week  - Fish and lean poultry: at least 2 a week  - Healthy protein sources include, chicken, turkey, lean beef, lamb - Increase intake of seafood such as tuna, salmon, trout, mackerel, shrimp, scallops - Avoid or limit high fat processed meats such as sausage and bacon  Dairy - Include moderate amounts of low fat dairy products  - Focus on healthy dairy such as fat free yogurt, skim milk, low or reduced fat cheese - Limit dairy products higher in fat such as whole or 2% milk, cheese, ice cream  Alcohol - Moderate amounts of red wine is ok  - No more than 5 oz daily for women (all ages) and men older than age 65  - No more than 10 oz of wine daily for men younger than 65  Other - Limit sweets and other desserts  - Use herbs and spices instead of salt to flavor foods  - Herbs and spices common to the traditional Mediterranean Diet include: basil, bay leaves, chives, cloves, cumin, fennel, garlic, lavender, marjoram, mint, oregano, parsley, pepper, rosemary, sage, savory, sumac, tarragon, thyme   It's not just a diet, it's a lifestyle:  . The Mediterranean diet includes   lifestyle factors typical of those in the region  . Foods, drinks and meals are best eaten with others and savored . Daily physical activity is important for overall good health . This could be strenuous exercise like running and aerobics . This could also be more leisurely activities such as walking, housework, yard-work, or taking the stairs . Moderation is the key; a balanced and  healthy diet accommodates most foods and drinks . Consider portion sizes and frequency of consumption of certain foods   Meal Ideas & Options:  . Breakfast:  o Whole wheat toast or whole wheat English muffins with peanut butter & hard boiled egg o Steel cut oats topped with apples & cinnamon and skim milk  o Fresh fruit: banana, strawberries, melon, berries, peaches  o Smoothies: strawberries, bananas, greek yogurt, peanut butter o Low fat greek yogurt with blueberries and granola  o Egg white omelet with spinach and mushrooms o Breakfast couscous: whole wheat couscous, apricots, skim milk, cranberries  . Sandwiches:  o Hummus and grilled vegetables (peppers, zucchini, squash) on whole wheat bread   o Grilled chicken on whole wheat pita with lettuce, tomatoes, cucumbers or tzatziki  o Tuna salad on whole wheat bread: tuna salad made with greek yogurt, olives, red peppers, capers, green onions o Garlic rosemary lamb pita: lamb sauted with garlic, rosemary, salt & pepper; add lettuce, cucumber, greek yogurt to pita - flavor with lemon juice and black pepper  . Seafood:  o Mediterranean grilled salmon, seasoned with garlic, basil, parsley, lemon juice and black pepper o Shrimp, lemon, and spinach whole-grain pasta salad made with low fat greek yogurt  o Seared scallops with lemon orzo  o Seared tuna steaks seasoned salt, pepper, coriander topped with tomato mixture of olives, tomatoes, olive oil, minced garlic, parsley, green onions and cappers  . Meats:  o Herbed greek chicken salad with kalamata olives, cucumber, feta  o Red bell peppers stuffed with spinach, bulgur, lean ground beef (or lentils) & topped with feta   o Kebabs: skewers of chicken, tomatoes, onions, zucchini, squash  o [place] burgers: made with red onions, mint, dill, lemon juice, feta cheese topped with roasted red peppers . Vegetarian o Cucumber salad: cucumbers, artichoke hearts, celery, red onion, feta cheese, tossed in  olive oil & lemon juice  o Hummus and whole grain pita points with a greek salad (lettuce, tomato, feta, olives, cucumbers, red onion) o Lentil soup with celery, carrots made with vegetable broth, garlic, salt and pepper  o Tabouli salad: parsley, bulgur, mint, scallions, cucumbers, tomato, radishes, lemon juice, olive oil, salt and pepper.     Cervical Radiculopathy  Cervical radiculopathy happens when a nerve in the neck (a cervical nerve) is pinched or bruised. This condition can happen because of an injury to the cervical spine (vertebrae) in the neck, or as part of the normal aging process. Pressure on the cervical nerves can cause pain or numbness that travels from the neck all the way down into the arm and fingers. Usually, this condition gets better with rest. Treatment may be needed if the condition does not improve. What are the causes? This condition may be caused by:  A neck injury.  A bulging (herniated) disk.  Muscle spasms.  Muscle tightness in the neck because of overuse.  Arthritis.  Breakdown or degeneration in the bones and joints of the spine (spondylosis) due to aging.  Bone spurs that may develop near the cervical nerves. What are the signs or symptoms? Symptoms  of this condition include:  Pain. The pain may travel from the neck to the arm and hand. The pain can be severe or irritating. It may be worse when you move your neck.  Numbness or tingling in your arm or hand.  Weakness in the affected arm and hand, in severe cases. How is this diagnosed? This condition may be diagnosed based on your symptoms, your medical history, and a physical exam. You may also have tests, including:  X-rays.  A CT scan.  An MRI.  An electromyogram (EMG).  Nerve conduction tests. How is this treated? In many cases, treatment is not needed for this condition. With rest, the condition usually gets better over time. If treatment is needed, options may include:  Wearing a  soft neck collar (cervical collar) for short periods of time, as told by your health care provider.  Doing physical therapy to strengthen your neck muscles.  Taking medicines, such as NSAIDs or oral corticosteroids.  Having spinal injections, in severe cases.  Having surgery. This may be needed if other treatments do not help. Different types of surgery may be done depending on the cause of this condition. Follow these instructions at home: If you have a cervical collar:  Wear it as told by your health care provider. Remove it only as told by your health care provider.  Ask your health care provider if you can remove the collar for cleaning and bathing. If you are allowed to remove the collar for cleaning or bathing: ? Follow instructions from your health care provider about how to remove the collar safely. ? Clean the collar by wiping it with mild soap and water and drying it completely. ? Take out any removable pads in the collar every 1-2 days, and wash them by hand with soap and water. Let them air-dry completely before you put them back in the collar. ? Check your skin under the collar for irritation or sores. If you see any, tell your health care provider. Managing pain      Take over-the-counter and prescription medicines only as told by your health care provider.  If directed, put ice on the affected area. ? If you have a soft neck collar, remove it as told by your health care provider. ? Put ice in a plastic bag. ? Place a towel between your skin and the bag. ? Leave the ice on for 20 minutes, 2-3 times a day.  If applying ice does not help, you can try using heat. Use the heat source that your health care provider recommends, such as a moist heat pack or a heating pad. ? Place a towel between your skin and the heat source. ? Leave the heat on for 20-30 minutes. ? Remove the heat if your skin turns bright red. This is especially important if you are unable to feel pain,  heat, or cold. You may have a greater risk of getting burned.  Try a gentle neck and shoulder massage to help relieve symptoms. Activity  Rest as needed.  Return to your normal activities as told by your health care provider. Ask your health care provider what activities are safe for you.  Do stretching and strengthening exercises as told by your health care provider or physical therapist.  Do not lift anything that is heavier than 10 lb (4.5 kg) until your health care provider tells you that it is safe. General instructions  Use a flat pillow when you sleep.  Do not drive while wearing  a cervical collar. If you do not have a cervical collar, ask your health care provider if it is safe to drive while your neck heals.  Ask your health care provider if the medicine prescribed to you requires you to avoid driving or using heavy machinery.  Do not use any products that contain nicotine or tobacco, such as cigarettes, e-cigarettes, and chewing tobacco. These can delay healing. If you need help quitting, ask your health care provider.  Keep all follow-up visits as told by your health care provider. This is important. Contact a health care provider if:  Your condition does not improve with treatment. Get help right away if:  Your pain gets much worse and cannot be controlled with medicines.  You have weakness or numbness in your hand, arm, face, or leg.  You have a high fever.  You have a stiff, rigid neck.  You lose control of your bowels or your bladder (have incontinence).  You have trouble with walking, balance, or speaking. Summary  Cervical radiculopathy happens when a nerve in the neck is pinched or bruised.  A nerve can get pinched from a bulging disk, arthritis, muscle spasms, or an injury to the neck.  Symptoms include pain, tingling, or numbness radiating from the neck into the arm or hand. Weakness can also occur in severe cases.  Treatment may include rest,  wearing a cervical collar, and physical therapy. Medicines may be prescribed to help with pain. In severe cases, injections or surgery may be needed. This information is not intended to replace advice given to you by your health care provider. Make sure you discuss any questions you have with your health care provider. Document Revised: 06/21/2018 Document Reviewed: 06/21/2018 Elsevier Patient Education  2020 [employer].

## 2019-09-09 ENCOUNTER — Ambulatory Visit: Payer: 59 | Admitting: Family Medicine

## 2019-09-24 ENCOUNTER — Other Ambulatory Visit: Payer: Self-pay | Admitting: Obstetrics and Gynecology

## 2019-09-25 ENCOUNTER — Other Ambulatory Visit: Payer: Self-pay | Admitting: Orthopedic Surgery

## 2019-09-25 DIAGNOSIS — M5030 Other cervical disc degeneration, unspecified cervical region: Secondary | ICD-10-CM

## 2019-09-25 DIAGNOSIS — M5412 Radiculopathy, cervical region: Secondary | ICD-10-CM

## 2019-09-25 LAB — SEMEN ANALYSIS, BASIC
Appearance: NORMAL
Concentration, Sperm: 67.5 x10E6/mL (ref >14.9)
Immotile Sperm: 26 %
Leukocyte Concentration: >1 x10E6/mL — ABNORMAL HIGH (ref <1.00)
Non-Progressive (NP): 8 %
Normal Morphology-Strict: 19 % (ref >3)
Progressive Motility (PR): 66 % (ref >31)
Progressively Motile Sperm: 66.6 x10E6
Time Collected: 944
Time Received: 1000
Time Since Last Emission: 3 days
Total Motile Sperm: 74.6 x10E6
Total Motility (PR+NP): 74 % (ref >39)
Total Sperm in Ejaculate: 101.2 x10E6 (ref >38.9)
Volume: 1.5 mL (ref >1.4)
pH: 9 (ref >7.1)

## 2019-10-06 ENCOUNTER — Ambulatory Visit
Admission: RE | Admit: 2019-10-06 | Discharge: 2019-10-06 | Disposition: A | Discharge status: Home / Self Care | Payer: 59 | Source: Ambulatory Visit | Attending: Orthopedic Surgery | Admitting: Orthopedic Surgery

## 2019-10-06 ENCOUNTER — Other Ambulatory Visit: Payer: Self-pay

## 2019-10-06 DIAGNOSIS — M5412 Radiculopathy, cervical region: Secondary | ICD-10-CM

## 2019-10-06 DIAGNOSIS — M5030 Other cervical disc degeneration, unspecified cervical region: Secondary | ICD-10-CM

## 2019-10-06 NOTE — Progress Notes (Signed)
Please inform patient that his semen analysis was overall fine.  He did have a few white blood cells present.  It does not warrant any treatment with antibiotics unless he is exhibiting any signs of like a urinary tract infection, for which he would need to address with his PCP.

## 2019-11-24 ENCOUNTER — Other Ambulatory Visit: Payer: Self-pay

## 2019-11-24 ENCOUNTER — Ambulatory Visit: Payer: 59 | Admitting: Family Medicine

## 2019-11-24 ENCOUNTER — Encounter: Payer: Self-pay | Admitting: Family Medicine

## 2019-11-24 VITALS — BP 100/60 | HR 80 | Ht 71.0 in | Wt 226.0 lb

## 2019-11-24 DIAGNOSIS — J0100 Acute maxillary sinusitis, unspecified: Secondary | ICD-10-CM | POA: Diagnosis not present

## 2019-11-24 MED ORDER — AZITHROMYCIN 250 MG PO TABS: ORAL_TABLET | ORAL | 0 refills | Status: DC

## 2019-11-24 NOTE — Progress Notes (Signed)
Date:  11/24/2019   Name:  Brian Reeves   DOB:  06/05/1986   MRN:  [id number]   Chief Complaint: Sinusitis (green production- started 5 days ago, congested)  Sinusitis This is a chronic problem. The current episode started in the past 7 days. The problem has been gradually worsening since onset. There has been no fever. The pain is mild. Associated symptoms include congestion, headaches and sinus pressure. Pertinent negatives include no chills, coughing, ear pain, neck pain, shortness of breath or sore throat.    Lab Results  Component Value Date   CREATININE 1.03 10/09/2018   BUN 15 10/09/2018   NA 141 10/09/2018   K 4.8 10/09/2018   CL 103 10/09/2018   CO2 24 10/09/2018   Lab Results  Component Value Date   CHOL 205 (H) 10/09/2018   HDL 35 (L) 10/09/2018   LDLCALC 149 (H) 10/09/2018   TRIG 107 10/09/2018   CHOLHDL 5.9 (H) 10/09/2018   No results found for: TSH No results found for: HGBA1C Lab Results  Component Value Date   WBC 3.7 (L) 10/28/2018   HGB 15.4 10/28/2018   HCT 44.8 10/28/2018   MCV 85.7 10/28/2018   PLT 180 10/28/2018   Lab Results  Component Value Date   ALT 28 10/09/2018   AST 18 10/09/2018   ALKPHOS 87 10/09/2018   BILITOT 0.5 10/09/2018     Review of Systems  Constitutional: Negative for chills and fever.  HENT: Positive for congestion and sinus pressure. Negative for drooling, ear discharge, ear pain and sore throat.   Respiratory: Negative for cough, shortness of breath and wheezing.   Cardiovascular: Negative for chest pain, palpitations and leg swelling.  Gastrointestinal: Negative for abdominal pain, blood in stool, constipation, diarrhea and nausea.  Endocrine: Negative for polydipsia.  Genitourinary: Negative for dysuria, frequency, hematuria and urgency.  Musculoskeletal: Negative for back pain, myalgias and neck pain.  Skin: Negative for rash.  Allergic/Immunologic: Negative for environmental allergies.  Neurological:  Positive for headaches. Negative for dizziness and seizures.  Hematological: Does not bruise/bleed easily.  Psychiatric/Behavioral: Negative for suicidal ideas. The patient is not nervous/anxious.     Patient Active Problem List   Diagnosis Date Noted  . Blood in stool   . Meckel diverticulum 12/20/2015  . Seasonal allergies 12/20/2015  . Exercise-induced asthma 12/20/2015    Allergies  Allergen Reactions  . Peanut-Containing Drug Products Anaphylaxis    (reaction to all nuts)    Past Surgical History:  Procedure Laterality Date  . arm fracture Left   . COLECTOMY  07/2013   open.  [place]  . COLONOSCOPY WITH PROPOFOL N/A 12/24/2015   Procedure: COLONOSCOPY WITH PROPOFOL;  Surgeon: Midge Minium, MD;  Location: [facility];  Service: Endoscopy;  Laterality: N/A;  LEAVE PT LAST  . INNER EAR SURGERY  as child   ruptured eardrum  . NASAL SEPTUM SURGERY  2015 and 2016  . TONSILLECTOMY  2015  . WISDOM TOOTH EXTRACTION  2015    Social History   Tobacco Use  . Smoking status: Former Smoker    Packs/day: 0.50    Years: 3.00    Pack years: 1.50    Types: Cigarettes  . Smokeless tobacco: Never Used  . Tobacco comment: quit approx 2009  Substance Use Topics  . Alcohol use: Yes    Alcohol/week: 6.0 standard drinks    Types: 3 Glasses of wine, 3 Shots of liquor per week    Comment:    .  Drug use: No     Medication list has been reviewed and updated.  No outpatient medications have been marked as taking for the 11/24/19 encounter (Office Visit) with Juline Patch, MD.    [facility] 2/9 Scores 11/24/2019 07/25/2019 10/09/2018 09/28/2017  PHQ - 2 Score 0 0 0 0  PHQ- 9 Score 0 - 0 0    BP Readings from Last 3 Encounters:  11/24/19 100/60  09/08/19 120/76  07/25/19 122/62    Physical Exam Vitals and nursing note reviewed.  HENT:     Head: Normocephalic.     Right Ear: Tympanic membrane, ear canal and external ear normal. There is no impacted cerumen.     Left  Ear: Tympanic membrane, ear canal and external ear normal. There is no impacted cerumen.     Nose: Nose normal. No congestion or rhinorrhea.     Mouth/Throat:     Mouth: Mucous membranes are moist.  Eyes:     General: No scleral icterus.       Right eye: No discharge.        Left eye: No discharge.     Conjunctiva/sclera: Conjunctivae normal.     Pupils: Pupils are equal, round, and reactive to light.  Neck:     Thyroid: No thyromegaly.     Vascular: No JVD.     Trachea: No tracheal deviation.  Cardiovascular:     Rate and Rhythm: Normal rate and regular rhythm.     Heart sounds: Normal heart sounds. No murmur. No friction rub. No gallop.   Pulmonary:     Effort: No respiratory distress.     Breath sounds: Normal breath sounds. No wheezing or rales.  Abdominal:     General: Bowel sounds are normal.     Palpations: Abdomen is soft. There is no mass.     Tenderness: There is no abdominal tenderness. There is no guarding or rebound.  Musculoskeletal:        General: No tenderness. Normal range of motion.     Cervical back: Normal range of motion and neck supple.  Lymphadenopathy:     Cervical: No cervical adenopathy.  Skin:    General: Skin is warm.     Findings: No rash.  Neurological:     Mental Status: He is alert and oriented to person, place, and time.     Cranial Nerves: No cranial nerve deficit.     Deep Tendon Reflexes: Reflexes are normal and symmetric.     Wt Readings from Last 3 Encounters:  11/24/19 226 lb (102.5 kg)  09/08/19 220 lb (99.8 kg)  07/25/19 221 lb (100.2 kg)    BP 100/60   Pulse 80   Ht 5\' 11"  (1.803 m)   Wt 226 lb (102.5 kg)   BMI 31.52 kg/m   Assessment and Plan:  1. Acute maxillary sinusitis, recurrence not specified Acute.  Persistent.  Stable.  Will initiate azithromycin to 50 mg 2 today followed by 1 a day for 4 days.

## 2020-02-26 ENCOUNTER — Emergency Department
Admission: EM | Admit: 2020-02-26 | Discharge: 2020-02-27 | Disposition: A | Discharge status: Home / Self Care | Payer: BC Managed Care – PPO | Attending: Emergency Medicine | Admitting: Emergency Medicine

## 2020-02-26 ENCOUNTER — Other Ambulatory Visit: Payer: Self-pay

## 2020-02-26 DIAGNOSIS — Z91010 Allergy to peanuts: Secondary | ICD-10-CM | POA: Insufficient documentation

## 2020-02-26 DIAGNOSIS — J45990 Exercise induced bronchospasm: Secondary | ICD-10-CM | POA: Insufficient documentation

## 2020-02-26 DIAGNOSIS — T782XXA Anaphylactic shock, unspecified, initial encounter: Secondary | ICD-10-CM

## 2020-02-26 DIAGNOSIS — Z87891 Personal history of nicotine dependence: Secondary | ICD-10-CM | POA: Insufficient documentation

## 2020-02-26 DIAGNOSIS — T7840XA Allergy, unspecified, initial encounter: Secondary | ICD-10-CM | POA: Diagnosis not present

## 2020-02-26 MED ORDER — DIPHENHYDRAMINE HCL 50 MG/ML IJ SOLN: 50.0000 mg | Freq: Once | INTRAMUSCULAR | Status: AC

## 2020-02-26 MED ORDER — EPINEPHRINE 0.3 MG/0.3ML IJ SOAJ
INTRAMUSCULAR | Status: AC
  Administered 2020-02-26: 0.3 mg via INTRAMUSCULAR
  Filled 2020-02-26: qty 0.3

## 2020-02-26 MED ORDER — EPINEPHRINE 0.3 MG/0.3ML IJ SOAJ: 0.3000 mg | INTRAMUSCULAR | 1 refills | Status: AC | PRN

## 2020-02-26 MED ORDER — DIPHENHYDRAMINE HCL 50 MG/ML IJ SOLN
INTRAMUSCULAR | Status: AC
  Administered 2020-02-26: 50 mg via INTRAVENOUS
  Filled 2020-02-26: qty 1

## 2020-02-26 MED ORDER — PREDNISONE 50 MG PO TABS: 50.0000 mg | ORAL_TABLET | Freq: Every day | ORAL | 0 refills | Status: DC

## 2020-02-26 MED ORDER — EPINEPHRINE 0.3 MG/0.3ML IJ SOAJ: 0.3000 mg | Freq: Once | INTRAMUSCULAR | Status: AC

## 2020-02-26 MED ORDER — METHYLPREDNISOLONE SODIUM SUCC 125 MG IJ SOLR
125.0000 mg | Freq: Once | INTRAMUSCULAR | Status: AC
  Administered 2020-02-26: 125 mg via INTRAVENOUS

## 2020-02-26 MED ORDER — FAMOTIDINE IN NACL 20-0.9 MG/50ML-% IV SOLN
20.0000 mg | Freq: Once | INTRAVENOUS | Status: AC
  Administered 2020-02-26: 20 mg via INTRAVENOUS

## 2020-02-26 NOTE — ED Triage Notes (Signed)
Pt reports consuming peanuts, in which he has an allergy to at local restaurant x20 minutes ago. Pt without epi pen. Pt c/o SOB, ear pain, throat swelling and chest tightness. Pt brought straigh back to room 18.

## 2020-02-26 NOTE — ED Notes (Signed)
Pt resting In bed at this time, states that he has no complaints and is ready for obs time to be complete so he can go home. Wife at bedside

## 2020-02-26 NOTE — ED Provider Notes (Addendum)
[facility] Emergency Department Provider Note   ____________________________________________    I have reviewed the triage vital signs and the nursing notes.   HISTORY  Chief Complaint Allergic Reaction     HPI Brian Reeves is a 34 y.o. male with a history of peanut allergy presents with allergic reaction.  Patient reports that he had a sauce at dinner which must of had peanut in it as he immediately developed tingling in his tongue, burning in his throat followed by congestion in his ears and then a feeling of shortness of breath.  Patient reports he has had peanut allergy all his life.  He has not used an EpiPen or taken any medications.  Symptoms started about 15-20 minutes prior to arrival.  Past Medical History:  Diagnosis Date  . Diverticulitis 2014   open colectomy in [place]  . Exercise-induced asthma 12/20/2015  . Seasonal allergies 12/20/2015    Patient Active Problem List   Diagnosis Date Noted  . Blood in stool   . Meckel diverticulum 12/20/2015  . Seasonal allergies 12/20/2015  . Exercise-induced asthma 12/20/2015    Past Surgical History:  Procedure Laterality Date  . arm fracture Left   . COLECTOMY  07/2013   open.  [place]  . COLONOSCOPY WITH PROPOFOL N/A 12/24/2015   Procedure: COLONOSCOPY WITH PROPOFOL;  Surgeon: Midge Minium, MD;  Location: [facility];  Service: Endoscopy;  Laterality: N/A;  LEAVE PT LAST  . INNER EAR SURGERY  as child   ruptured eardrum  . NASAL SEPTUM SURGERY  2015 and 2016  . TONSILLECTOMY  2015  . WISDOM TOOTH EXTRACTION  2015    Prior to Admission medications   Medication Sig Start Date End Date Taking? Authorizing Provider  azithromycin (ZITHROMAX) 250 MG tablet 2 today then 1 a day for 4 days 11/24/19   Duanne Limerick, MD  EPINEPHrine 0.3 mg/0.3 mL IJ SOAJ injection Inject 0.3 mLs (0.3 mg total) into the muscle as needed for anaphylaxis. 02/26/20   Jene Every, MD    predniSONE (DELTASONE) 50 MG tablet Take 1 tablet (50 mg total) by mouth daily with breakfast. 02/26/20   Jene Every, MD     Allergies Peanut-containing drug products  Family History  Problem Relation Age of Onset  . Hypertension Father   . Cancer Maternal Aunt   . Diabetes Maternal Grandfather     Social History Social History   Tobacco Use  . Smoking status: Former Smoker    Packs/day: 0.50    Years: 3.00    Pack years: 1.50    Types: Cigarettes  . Smokeless tobacco: Never Used  . Tobacco comment: quit approx 2009  Substance Use Topics  . Alcohol use: Yes    Alcohol/week: 6.0 standard drinks    Types: 3 Glasses of wine, 3 Shots of liquor per week    Comment:    . Drug use: No    Review of Systems  Constitutional: No lightheadedness Eyes: No facial swelling ENT: Swelling as above Cardiovascular: Denies chest pain. Respiratory: As above Gastrointestinal: , no vomiting.   Genitourinary: Negative for GU complaints Musculoskeletal: Negative for joint pain Skin: Negative for rash. Neurological: Negative for headaches   ____________________________________________   PHYSICAL EXAM:  VITAL SIGNS: ED Triage Vitals  Enc Vitals Group     BP 02/26/20 1950 (!) 136/97     Pulse Rate 02/26/20 1950 (!) 128     Resp 02/26/20 1950 18  Temp 02/26/20 1950 98.8 F (37.1 C)     Temp Source 02/26/20 1950 Oral     SpO2 02/26/20 1950 99 %     Weight 02/26/20 1952 97.5 kg (215 lb)     Height 02/26/20 1952 1.803 m (5\' 11" )     Head Circumference --      Peak Flow --      Pain Score 02/26/20 1952 0     Pain Loc --      Pain Edu? --      Excl. in GC? --     Constitutional: Alert and oriented. Eyes: Conjunctivae are normal.  Head: Atraumatic. Nose: No congestion/rhinnorhea. Mouth/Throat: Mucous membranes are moist.  No pharyngeal swelling noted, uvula normal Neck:  Painless ROM, no palpable swelling, no stridor Cardiovascular: Normal rate, regular rhythm. 02/28/20 peripheral circulation. Respiratory: Normal respiratory effort.  No retractions. Lungs CTAB. Gastrointestinal: Soft and nontender. No distention.    Musculoskeletal:   Warm and well perfused Neurologic:  Normal speech and language. No gross focal neurologic deficits are appreciated.  Skin:  Skin is warm, dry and intact. No rash noted. Psychiatric: Mood and affect are normal. Speech and behavior are normal.  ____________________________________________   LABS (all labs ordered are listed, but only abnormal results are displayed)  Labs Reviewed - No data to display ____________________________________________  EKG  None ____________________________________________  RADIOLOGY  None ____________________________________________   PROCEDURES  Procedure(s) performed: No  Procedures   Critical Care performed: yes  CRITICAL CARE Performed by: Peri Jefferson   Total critical care time: 30 minutes  Critical care time was exclusive of separately billable procedures and treating other patients.  Critical care was necessary to treat or prevent imminent or life-threatening deterioration.  Critical care was time spent personally by me on the following activities: development of treatment plan with patient and/or surrogate as well as nursing, discussions with consultants, evaluation of patient's response to treatment, examination of patient, obtaining history from patient or surrogate, ordering and performing treatments and interventions, ordering and review of laboratory studies, ordering and review of radiographic studies, pulse oximetry and re-evaluation of patient's condition.  ____________________________________________   INITIAL IMPRESSION / ASSESSMENT AND PLAN / ED COURSE  Pertinent labs & imaging results that were available during my care of the patient were reviewed by me and considered in my medical decision making (see chart for details).  Patient presents with  presentation consistent with acute anaphylactic reaction to peanuts.  Brought back immediately, 0.3 mg IM epinephrine given, IV placed.  Patient given IV Benadryl, IV Solu-Medrol, IV Pepcid.  ----------------------------------------- 8:17 PM on 02/26/2020 -----------------------------------------  Patient reports notable improvement already, will continue to monitor for 4 hours   ----------------------------------------- 8:50 PM on 02/26/2020 -----------------------------------------  Patient remained stable   Will ask my colleague to continue to reevaluate, if continued improvement anticipate discharge at midnight       ____________________________________________   FINAL CLINICAL IMPRESSION(S) / ED DIAGNOSES  Final diagnoses:  Anaphylaxis, initial encounter        Note:  This document was prepared using Dragon voice recognition software and may include unintentional dictation errors.   02/28/2020, MD 02/26/20 2020    02/28/20, MD 02/26/20 2050

## 2020-02-26 NOTE — ED Provider Notes (Signed)
-----------------------------------------   11:08 PM on 02/26/2020 -----------------------------------------  Blood pressure 114/74, pulse 92, temperature 98.8 F (37.1 C), temperature source Oral, resp. rate 16, height 5\' 11"  (1.803 m), weight 97.5 kg, SpO2 94 %.  Assuming care from Dr. .  In short, Brian Reeves is a 34 y.o. male with a chief complaint of Allergic Reaction .  Refer to the original H&P for additional details.  The current plan of care is to observe for recurrent allergic reaction.  ----------------------------------------- 12:55 AM on 02/27/2020 -----------------------------------------  Patient states all of his symptoms are resolved and he feels back to normal.  He has had no recurrence of anaphylactic symptoms over 4 hours of observation and is appropriate for discharge home.  Prescriptions for EpiPen and prednisone were ordered by previous provider, patient was counseled to follow-up with his PCP and return to the ED for new worsening symptoms.  Patient agrees with plan.    02/29/2020, MD 02/27/20 [id number]

## 2020-02-26 NOTE — ED Notes (Addendum)
Pt resting in bed at this time, reports exaggerated improvement of symptoms. Reports improvement of speech and overall feeling. Pt states at restaurant he was at that recipe change occurred without knowledge and coconut was replaced with peanut sauce. Reports only consuming 1 bite and immediately being aware of what he was feeling

## 2020-02-27 NOTE — ED Notes (Signed)
Pt reports readiness to leave at this time, denies any ill feelings. States he feels like his normal self, denies SOB, itching, swelling, or any other symptoms of reactions. MD notified, states he will speak to pt shortly.

## 2020-03-01 DIAGNOSIS — L578 Other skin changes due to chronic exposure to nonionizing radiation: Secondary | ICD-10-CM | POA: Diagnosis not present

## 2020-03-01 DIAGNOSIS — Z86018 Personal history of other benign neoplasm: Secondary | ICD-10-CM | POA: Diagnosis not present

## 2020-03-01 DIAGNOSIS — L720 Epidermal cyst: Secondary | ICD-10-CM | POA: Diagnosis not present

## 2020-03-01 DIAGNOSIS — Z808 Family history of malignant neoplasm of other organs or systems: Secondary | ICD-10-CM | POA: Diagnosis not present

## 2020-03-11 DIAGNOSIS — J309 Allergic rhinitis, unspecified: Secondary | ICD-10-CM | POA: Diagnosis not present

## 2020-03-11 DIAGNOSIS — J305 Allergic rhinitis due to food: Secondary | ICD-10-CM | POA: Diagnosis not present

## 2020-03-11 DIAGNOSIS — H6123 Impacted cerumen, bilateral: Secondary | ICD-10-CM | POA: Diagnosis not present

## 2020-03-11 DIAGNOSIS — T782XXA Anaphylactic shock, unspecified, initial encounter: Secondary | ICD-10-CM | POA: Diagnosis not present

## 2020-03-11 DIAGNOSIS — J301 Allergic rhinitis due to pollen: Secondary | ICD-10-CM | POA: Diagnosis not present

## 2020-07-29 ENCOUNTER — Ambulatory Visit: Payer: BC Managed Care – PPO | Admitting: Family Medicine

## 2020-07-29 ENCOUNTER — Encounter: Payer: Self-pay | Admitting: Family Medicine

## 2020-07-29 ENCOUNTER — Telehealth: Payer: Self-pay

## 2020-07-29 ENCOUNTER — Other Ambulatory Visit: Payer: Self-pay

## 2020-07-29 VITALS — BP 120/80 | HR 80 | Ht 71.0 in | Wt 223.0 lb

## 2020-07-29 DIAGNOSIS — J0100 Acute maxillary sinusitis, unspecified: Secondary | ICD-10-CM

## 2020-07-29 MED ORDER — AZITHROMYCIN 250 MG PO TABS: ORAL_TABLET | ORAL | 0 refills | Status: DC

## 2020-07-29 MED ORDER — PREDNISONE 10 MG PO TABS: 10.0000 mg | ORAL_TABLET | Freq: Every day | ORAL | 0 refills | Status: DC

## 2020-07-29 NOTE — Telephone Encounter (Signed)
Put in at 4 this afternoon

## 2020-07-29 NOTE — Telephone Encounter (Signed)
Copied from CRM [id number]. Topic: General - Other >> Jul 29, 2020  9:36 AM Lyn Hollingshead D wrote: Pt need a call  back from Delice Bison / Cold symptoms / no appts till Monday / please advise

## 2020-07-29 NOTE — Progress Notes (Signed)
Date:  07/29/2020   Name:  Brian Reeves   DOB:  08-09-1986   MRN:  [id number]   Chief Complaint: Sinusitis (Started Monday with yellow production- had sore throat on Wednesday and still having yellow production. Toddler is sick)  Sinusitis This is a new problem. The current episode started more than 1 year ago. The problem has been gradually improving since onset. There has been no fever. The pain is moderate. Pertinent negatives include no chills, congestion, coughing, diaphoresis, ear pain, headaches, hoarse voice, neck pain, shortness of breath, sinus pressure, sneezing, sore throat or swollen glands. Past treatments include nothing. The treatment provided moderate relief.    Lab Results  Component Value Date   CREATININE 1.03 10/09/2018   BUN 15 10/09/2018   NA 141 10/09/2018   K 4.8 10/09/2018   CL 103 10/09/2018   CO2 24 10/09/2018   Lab Results  Component Value Date   CHOL 205 (H) 10/09/2018   HDL 35 (L) 10/09/2018   LDLCALC 149 (H) 10/09/2018   TRIG 107 10/09/2018   CHOLHDL 5.9 (H) 10/09/2018   No results found for: TSH No results found for: HGBA1C Lab Results  Component Value Date   WBC 3.7 (L) 10/28/2018   HGB 15.4 10/28/2018   HCT 44.8 10/28/2018   MCV 85.7 10/28/2018   PLT 180 10/28/2018   Lab Results  Component Value Date   ALT 28 10/09/2018   AST 18 10/09/2018   ALKPHOS 87 10/09/2018   BILITOT 0.5 10/09/2018     Review of Systems  Constitutional: Negative for chills, diaphoresis and fever.  HENT: Negative for congestion, drooling, ear discharge, ear pain, hoarse voice, postnasal drip, rhinorrhea, sinus pressure, sneezing and sore throat.   Respiratory: Negative for cough, shortness of breath and wheezing.   Cardiovascular: Negative for chest pain, palpitations and leg swelling.  Gastrointestinal: Negative for abdominal pain, blood in stool, constipation, diarrhea and nausea.  Endocrine: Negative for polydipsia.  Genitourinary: Negative for  dysuria, frequency, hematuria and urgency.  Musculoskeletal: Negative for back pain, myalgias and neck pain.  Skin: Negative for rash.  Allergic/Immunologic: Negative for environmental allergies.  Neurological: Negative for dizziness and headaches.  Hematological: Does not bruise/bleed easily.  Psychiatric/Behavioral: Negative for suicidal ideas. The patient is not nervous/anxious.     Patient Active Problem List   Diagnosis Date Noted  . Blood in stool   . Meckel diverticulum 12/20/2015  . Seasonal allergies 12/20/2015  . Exercise-induced asthma 12/20/2015    Allergies  Allergen Reactions  . Peanut-Containing Drug Products Anaphylaxis    (reaction to all nuts)    Past Surgical History:  Procedure Laterality Date  . arm fracture Left   . COLECTOMY  07/2013   open.  [place]  . COLONOSCOPY WITH PROPOFOL N/A 12/24/2015   Procedure: COLONOSCOPY WITH PROPOFOL;  Surgeon: Midge Minium, MD;  Location: [facility];  Service: Endoscopy;  Laterality: N/A;  LEAVE PT LAST  . INNER EAR SURGERY  as child   ruptured eardrum  . NASAL SEPTUM SURGERY  2015 and 2016  . TONSILLECTOMY  2015  . WISDOM TOOTH EXTRACTION  2015    Social History   Tobacco Use  . Smoking status: Former Smoker    Packs/day: 0.50    Years: 3.00    Pack years: 1.50    Types: Cigarettes  . Smokeless tobacco: Never Used  . Tobacco comment: quit approx 2009  Substance Use Topics  . Alcohol use: Yes  Alcohol/week: 6.0 standard drinks    Types: 3 Glasses of wine, 3 Shots of liquor per week    Comment:    . Drug use: No     Medication list has been reviewed and updated.  Current Meds  Medication Sig  . EPINEPHrine 0.3 mg/0.3 mL IJ SOAJ injection Inject 0.3 mLs (0.3 mg total) into the muscle as needed for anaphylaxis.  . [DISCONTINUED] acetaminophen (TYLENOL) 500 MG tablet Take by mouth.  . [DISCONTINUED] ibuprofen (ADVIL) 400 MG tablet Take by mouth.    PHQ 2/9 Scores 07/29/2020 11/24/2019  07/25/2019 10/09/2018  PHQ - 2 Score 0 0 0 0  PHQ- 9 Score 0 0 - 0    GAD 7 : Generalized Anxiety Score 07/29/2020 11/24/2019  Nervous, Anxious, on Edge 0 0  Control/stop worrying 0 0  Worry too much - different things 0 0  Trouble relaxing 0 0  Restless 0 0  Easily annoyed or irritable 0 0  Afraid - awful might happen 0 0  Total GAD 7 Score 0 0    BP Readings from Last 3 Encounters:  07/29/20 120/80  02/27/20 116/84  11/24/19 100/60    Physical Exam Vitals and nursing note reviewed.  HENT:     Head: Normocephalic.     Right Ear: Tympanic membrane, ear canal and external ear normal. There is no impacted cerumen.     Left Ear: Tympanic membrane, ear canal and external ear normal. There is no impacted cerumen.     Nose: Nose normal. No congestion or rhinorrhea.     Mouth/Throat:     Mouth: Oropharynx is clear and moist.  Eyes:     General: No scleral icterus.       Right eye: No discharge.        Left eye: No discharge.     Extraocular Movements: EOM normal.     Conjunctiva/sclera: Conjunctivae normal.     Pupils: Pupils are equal, round, and reactive to light.  Neck:     Thyroid: No thyromegaly.     Vascular: No JVD.     Trachea: No tracheal deviation.  Cardiovascular:     Rate and Rhythm: Normal rate and regular rhythm.     Pulses: Intact distal pulses.     Heart sounds: Normal heart sounds. No murmur heard. No friction rub. No gallop.   Pulmonary:     Effort: No respiratory distress.     Breath sounds: Normal breath sounds. No wheezing, rhonchi or rales.  Abdominal:     General: Bowel sounds are normal.     Palpations: Abdomen is soft. There is no hepatosplenomegaly or mass.     Tenderness: There is no abdominal tenderness. There is no CVA tenderness, guarding or rebound.  Musculoskeletal:        General: No tenderness (sinusitis) or edema. Normal range of motion.     Cervical back: Normal range of motion and neck supple.  Lymphadenopathy:     Cervical: No  cervical adenopathy.  Skin:    General: Skin is warm.     Findings: No rash.  Neurological:     Mental Status: He is alert and oriented to person, place, and time.     Cranial Nerves: No cranial nerve deficit.     Deep Tendon Reflexes: Strength normal and reflexes are normal and symmetric.     Wt Readings from Last 3 Encounters:  07/29/20 223 lb (101.2 kg)  02/26/20 215 lb (97.5 kg)  11/24/19 226 lb (102.5  kg)    BP 120/80   Pulse 80   Ht 5\' 11"  (1.803 m)   Wt 223 lb (101.2 kg)   BMI 31.10 kg/m   Assessment and Plan: 1. Acute maxillary sinusitis, recurrence not specified New onset.  Persistent.  Stable.  Exam is consistent with acute sinus infection of the maxillary bilateral.  We will initiate a azithromycin to 50 mg 2 today followed by by 1 a day for 4 days and prednisone which has been taken in the past 10 mg a day for 10 days. - azithromycin (ZITHROMAX) 250 MG tablet; 2 today then one a day for 4 days  Dispense: 6 tablet; Refill: 0 - predniSONE (DELTASONE) 10 MG tablet; Take 1 tablet (10 mg total) by mouth daily with breakfast.  Dispense: 30 tablet; Refill: 0

## 2020-09-01 ENCOUNTER — Telehealth: Payer: Self-pay

## 2020-09-01 NOTE — Telephone Encounter (Signed)
Copied from CRM [id number]. Topic: General - Other >> Sep 01, 2020  9:38 AM Leafy Ro wrote: Reason for CRM: Pt is calling and would like to talk with tara. Pt does not want to discuss the reason with me.

## 2020-09-01 NOTE — Telephone Encounter (Signed)
Spoke to pt- gave number to call and get tested for COVID

## 2020-09-04 DIAGNOSIS — Z20822 Contact with and (suspected) exposure to covid-19: Secondary | ICD-10-CM | POA: Diagnosis not present

## 2020-09-07 DIAGNOSIS — Z20822 Contact with and (suspected) exposure to covid-19: Secondary | ICD-10-CM | POA: Diagnosis not present

## 2020-09-23 ENCOUNTER — Other Ambulatory Visit: Payer: Self-pay

## 2020-09-23 ENCOUNTER — Telehealth: Payer: Self-pay

## 2020-09-23 DIAGNOSIS — R059 Cough, unspecified: Secondary | ICD-10-CM

## 2020-09-23 NOTE — Progress Notes (Unsigned)
Placed ref to pulm,

## 2020-09-23 NOTE — Telephone Encounter (Signed)
Sent to pulmonology

## 2020-10-18 DIAGNOSIS — R053 Chronic cough: Secondary | ICD-10-CM | POA: Diagnosis not present

## 2020-10-26 DIAGNOSIS — Z3189 Encounter for other procreative management: Secondary | ICD-10-CM | POA: Diagnosis not present

## 2021-01-18 DIAGNOSIS — J4540 Moderate persistent asthma, uncomplicated: Secondary | ICD-10-CM | POA: Diagnosis not present

## 2021-05-05 DIAGNOSIS — D225 Melanocytic nevi of trunk: Secondary | ICD-10-CM | POA: Diagnosis not present

## 2021-05-05 DIAGNOSIS — Z86018 Personal history of other benign neoplasm: Secondary | ICD-10-CM | POA: Diagnosis not present

## 2021-05-05 DIAGNOSIS — L578 Other skin changes due to chronic exposure to nonionizing radiation: Secondary | ICD-10-CM | POA: Diagnosis not present

## 2021-05-19 DIAGNOSIS — J060 Acute laryngopharyngitis: Secondary | ICD-10-CM | POA: Diagnosis not present

## 2021-05-19 DIAGNOSIS — R051 Acute cough: Secondary | ICD-10-CM | POA: Diagnosis not present

## 2021-05-23 ENCOUNTER — Telehealth: Payer: Self-pay

## 2021-05-23 NOTE — Telephone Encounter (Signed)
Patient coming in tomorrow for 1140 per Dr Yetta Barre. Cough and voice is hoarse for 2 weeks.

## 2021-05-23 NOTE — Telephone Encounter (Signed)
Copied from CRM [id number]. Topic: Appointment Scheduling - Scheduling Inquiry for Clinic >> May 23, 2021 11:32 AM Gwenlyn Fudge wrote: Reason for CRM: Pt called stating that he is hoarse and has congestion. He states that he did a telehealth visit and was put on prednisone. He states he is on his last day of prednisone and still no relief. Pt states he cannot wait until next available with PCP on 05/25/21 and is requesting to have medication sent in or to be seen sooner. Please advise.

## 2021-05-24 ENCOUNTER — Encounter: Payer: Self-pay | Admitting: Family Medicine

## 2021-05-24 ENCOUNTER — Ambulatory Visit
Admission: RE | Admit: 2021-05-24 | Discharge: 2021-05-24 | Disposition: A | Discharge status: Home / Self Care | Payer: BC Managed Care – PPO | Source: Ambulatory Visit | Attending: Family Medicine | Admitting: Family Medicine

## 2021-05-24 ENCOUNTER — Ambulatory Visit: Payer: BC Managed Care – PPO | Admitting: Family Medicine

## 2021-05-24 ENCOUNTER — Ambulatory Visit
Admission: RE | Admit: 2021-05-24 | Discharge: 2021-05-24 | Disposition: A | Discharge status: Home / Self Care | Payer: BC Managed Care – PPO | Attending: Family Medicine | Admitting: Family Medicine

## 2021-05-24 ENCOUNTER — Other Ambulatory Visit: Payer: Self-pay

## 2021-05-24 VITALS — BP 120/80 | HR 84 | Resp 12 | Ht 71.0 in | Wt 223.0 lb

## 2021-05-24 DIAGNOSIS — J4521 Mild intermittent asthma with (acute) exacerbation: Secondary | ICD-10-CM | POA: Diagnosis not present

## 2021-05-24 DIAGNOSIS — R051 Acute cough: Secondary | ICD-10-CM

## 2021-05-24 DIAGNOSIS — R059 Cough, unspecified: Secondary | ICD-10-CM | POA: Diagnosis not present

## 2021-05-24 DIAGNOSIS — J219 Acute bronchiolitis, unspecified: Secondary | ICD-10-CM

## 2021-05-24 MED ORDER — AZITHROMYCIN 250 MG PO TABS: ORAL_TABLET | ORAL | 0 refills | Status: AC

## 2021-05-24 MED ORDER — PREDNISONE 10 MG PO TABS: 10.0000 mg | ORAL_TABLET | Freq: Every day | ORAL | 0 refills | Status: DC

## 2021-05-24 MED ORDER — GUAIFENESIN-CODEINE 100-10 MG/5ML PO SYRP: 5.0000 mL | ORAL_SOLUTION | Freq: Three times a day (TID) | ORAL | 0 refills | Status: DC | PRN

## 2021-05-24 NOTE — Progress Notes (Addendum)
Date:  05/24/2021   Name:  Brian Reeves   DOB:  01/04/1986   MRN:  [id number]   Chief Complaint: Cough (Cough with hoarseness and tickle in throat. Some production- clearing up. Finished prednisone yesterday 40mg  qday x 5 days. Also taking Mucinex)  Cough This is a recurrent problem. The current episode started 1 to 4 weeks ago. The problem has been gradually improving. The problem occurs every few minutes. The cough is Productive of sputum. Associated symptoms include ear congestion, headaches, nasal congestion, postnasal drip, a sore throat and wheezing. Pertinent negatives include no chest pain, chills, ear pain, fever, heartburn, hemoptysis, myalgias, rash, rhinorrhea, shortness of breath, sweats or weight loss. The symptoms are aggravated by lying down. He has tried oral steroids and steroid inhaler (mucinex) for the symptoms. The treatment provided mild relief. His past medical history is significant for asthma.   Lab Results  Component Value Date   CREATININE 1.03 10/09/2018   BUN 15 10/09/2018   NA 141 10/09/2018   K 4.8 10/09/2018   CL 103 10/09/2018   CO2 24 10/09/2018   Lab Results  Component Value Date   CHOL 205 (H) 10/09/2018   HDL 35 (L) 10/09/2018   LDLCALC 149 (H) 10/09/2018   TRIG 107 10/09/2018   CHOLHDL 5.9 (H) 10/09/2018   No results found for: TSH No results found for: HGBA1C Lab Results  Component Value Date   WBC 3.7 (L) 10/28/2018   HGB 15.4 10/28/2018   HCT 44.8 10/28/2018   MCV 85.7 10/28/2018   PLT 180 10/28/2018   Lab Results  Component Value Date   ALT 28 10/09/2018   AST 18 10/09/2018   ALKPHOS 87 10/09/2018   BILITOT 0.5 10/09/2018     Review of Systems  Constitutional:  Negative for chills, fever and weight loss.  HENT:  Positive for postnasal drip and sore throat. Negative for ear pain and rhinorrhea.   Respiratory:  Positive for cough and wheezing. Negative for hemoptysis and shortness of breath.   Cardiovascular:  Negative  for chest pain.  Gastrointestinal:  Negative for heartburn.  Musculoskeletal:  Negative for myalgias.  Skin:  Negative for rash.  Neurological:  Positive for headaches.   Patient Active Problem List   Diagnosis Date Noted   Blood in stool    Meckel diverticulum 12/20/2015   Seasonal allergies 12/20/2015   Exercise-induced asthma 12/20/2015    Allergies  Allergen Reactions   Peanut-Containing Drug Products Anaphylaxis    (reaction to all nuts)    Past Surgical History:  Procedure Laterality Date   arm fracture Left    COLECTOMY  07/2013   open.  [place] 08/2013   COLONOSCOPY WITH PROPOFOL N/A 12/24/2015   Procedure: COLONOSCOPY WITH PROPOFOL;  Surgeon: 02/23/2016, MD;  Location: [facility];  Service: Endoscopy;  Laterality: N/A;  LEAVE PT LAST   INNER EAR SURGERY  as child   ruptured eardrum   NASAL SEPTUM SURGERY  2015 and 2016   TONSILLECTOMY  2015   WISDOM TOOTH EXTRACTION  2015    Social History   Tobacco Use   Smoking status: Former    Packs/day: 0.50    Years: 3.00    Pack years: 1.50    Types: Cigarettes   Smokeless tobacco: Never   Tobacco comments:    quit approx 2009  Substance Use Topics   Alcohol use: Yes    Alcohol/week: 6.0 standard drinks    Types: 3 Glasses of wine,  3 Shots of liquor per week    Comment:     Drug use: No     Medication list has been reviewed and updated.  Current Meds  Medication Sig   EPINEPHrine 0.3 mg/0.3 mL IJ SOAJ injection Inject 0.3 mLs (0.3 mg total) into the muscle as needed for anaphylaxis.   FLOVENT HFA 110 MCG/ACT inhaler Inhale 1 puff into the lungs 2 (two) times daily.   fluticasone (FLONASE) 50 MCG/ACT nasal spray Place 2 sprays into both nostrils daily.   montelukast (SINGULAIR) 10 MG tablet Take by mouth.   omeprazole (PRILOSEC) 40 MG capsule Take 1 capsule by mouth daily.    PHQ 2/9 Scores 07/29/2020 11/24/2019 07/25/2019 10/09/2018  PHQ - 2 Score 0 0 0 0  PHQ- 9 Score 0 0 - 0    GAD 7 :  Generalized Anxiety Score 07/29/2020 11/24/2019  Nervous, Anxious, on Edge 0 0  Control/stop worrying 0 0  Worry too much - different things 0 0  Trouble relaxing 0 0  Restless 0 0  Easily annoyed or irritable 0 0  Afraid - awful might happen 0 0  Total GAD 7 Score 0 0    BP Readings from Last 3 Encounters:  07/29/20 120/80  02/27/20 116/84  11/24/19 100/60    Physical Exam Vitals and nursing note reviewed.  HENT:     Head: Normocephalic.     Right Ear: Tympanic membrane and external ear normal.     Left Ear: Tympanic membrane and external ear normal.     Nose: Nose normal.     Mouth/Throat:     Pharynx: Oropharynx is clear. No pharyngeal swelling, oropharyngeal exudate or uvula swelling.  Eyes:     General: No scleral icterus.       Right eye: No discharge.        Left eye: No discharge.     Conjunctiva/sclera: Conjunctivae normal.     Pupils: Pupils are equal, round, and reactive to light.  Neck:     Thyroid: No thyromegaly.     Vascular: No JVD.     Trachea: No tracheal deviation.  Cardiovascular:     Rate and Rhythm: Normal rate and regular rhythm.     Heart sounds: Normal heart sounds, S1 normal and S2 normal. No murmur heard. No systolic murmur is present.  No diastolic murmur is present.    No friction rub. No gallop. No S3 or S4 sounds.  Pulmonary:     Effort: No respiratory distress.     Breath sounds: Decreased air movement present. Wheezing and rhonchi present. No decreased breath sounds or rales.  Abdominal:     General: Bowel sounds are normal.     Palpations: Abdomen is soft. There is no mass.     Tenderness: There is no abdominal tenderness. There is no guarding or rebound.  Musculoskeletal:        General: No tenderness. Normal range of motion.     Cervical back: Normal range of motion and neck supple.  Lymphadenopathy:     Cervical: No cervical adenopathy.  Skin:    General: Skin is warm.     Findings: No rash.  Neurological:     Mental  Status: He is alert and oriented to person, place, and time.     Cranial Nerves: No cranial nerve deficit.     Deep Tendon Reflexes: Reflexes are normal and symmetric.    Wt Readings from Last 3 Encounters:  05/24/21 223 lb (101.2 kg)  07/29/20 223 lb (101.2 kg)  02/26/20 215 lb (97.5 kg)    Ht 5\' 11"  (1.803 m)   Wt 223 lb (101.2 kg)   BMI 31.10 kg/m   Assessment and Plan:  1. Mild intermittent reactive airway disease with acute exacerbation Chronic airway reactive concern.  Followed by pulmonary at [facility].  Patient does have some reactivity in his airways on exam today.  Patient is supposed to be on breast tree and albuterol which currently not taking but is taking prednisone/Flovent and is recently finished 40 mg prednisone daily.  At this point in time patient has been encouraged to resume his albuterol 1 to 2 puffs every 6 hours as well as continuing his Flovent 1 puff twice a day.  Patient has been resumed on prednisone but at 10 mg daily.  We will obtain a chest x-ray because baseline pulse ox has been mid 90s and there is productive nasal/bronchial drainage.  We will obtain a chest x-ray to see current status of lungs as well as if there is a community-acquired pneumonia.  In the event that this is been continue once and that there may be a CAP we will treat with azithromycin to 50 mg 2 today followed by 1 a day for 4 days.  Patient has been instructed that he needs to use his albuterol on a regular basis given his reactive airway disease and the environmental concerns of mold and pollen counts. - azithromycin (ZITHROMAX) 250 MG tablet; Take 2 tablets on day 1, then 1 tablet daily on days 2 through 5  Dispense: 6 tablet; Refill: 0 - DG Chest 2 View - predniSONE (DELTASONE) 10 MG tablet; Take 1 tablet (10 mg total) by mouth daily with breakfast.  Dispense: 30 tablet; Refill: 0  2. Bronchiolitis As noted there is some reactivity but I do not know what his baseline he is at this time and  we will resume his albuterol as well as reinitiate prednisone but at 10 mg a day. - predniSONE (DELTASONE) 10 MG tablet; Take 1 tablet (10 mg total) by mouth daily with breakfast.  Dispense: 30 tablet; Refill: 0  3. Acute cough Patient does have an acute cough which we will count on with guaifenesin with codeine teaspoon every 8 hours as needed for cough. - guaiFENesin-codeine (ROBITUSSIN AC) 100-10 MG/5ML syrup; Take 5 mLs by mouth 3 (three) times daily as needed for cough.  Dispense: 118 mL; Refill: 0

## 2021-06-13 DIAGNOSIS — M25571 Pain in right ankle and joints of right foot: Secondary | ICD-10-CM | POA: Diagnosis not present

## 2021-06-13 DIAGNOSIS — S93401A Sprain of unspecified ligament of right ankle, initial encounter: Secondary | ICD-10-CM | POA: Diagnosis not present

## 2021-06-23 DIAGNOSIS — M25571 Pain in right ankle and joints of right foot: Secondary | ICD-10-CM | POA: Diagnosis not present

## 2021-06-27 DIAGNOSIS — Z0001 Encounter for general adult medical examination with abnormal findings: Secondary | ICD-10-CM | POA: Diagnosis not present

## 2021-06-27 DIAGNOSIS — Z6832 Body mass index (BMI) 32.0-32.9, adult: Secondary | ICD-10-CM | POA: Diagnosis not present

## 2021-06-27 DIAGNOSIS — Z91018 Allergy to other foods: Secondary | ICD-10-CM | POA: Diagnosis not present

## 2021-06-27 DIAGNOSIS — R519 Headache, unspecified: Secondary | ICD-10-CM | POA: Diagnosis not present

## 2021-06-27 DIAGNOSIS — J45909 Unspecified asthma, uncomplicated: Secondary | ICD-10-CM | POA: Diagnosis not present

## 2021-06-27 DIAGNOSIS — E663 Overweight: Secondary | ICD-10-CM | POA: Diagnosis not present

## 2021-06-27 DIAGNOSIS — K219 Gastro-esophageal reflux disease without esophagitis: Secondary | ICD-10-CM | POA: Diagnosis not present

## 2021-06-27 DIAGNOSIS — E785 Hyperlipidemia, unspecified: Secondary | ICD-10-CM | POA: Diagnosis not present

## 2021-07-12 DIAGNOSIS — J3089 Other allergic rhinitis: Secondary | ICD-10-CM | POA: Diagnosis not present

## 2021-07-12 DIAGNOSIS — J4530 Mild persistent asthma, uncomplicated: Secondary | ICD-10-CM | POA: Diagnosis not present

## 2021-07-12 DIAGNOSIS — J301 Allergic rhinitis due to pollen: Secondary | ICD-10-CM | POA: Diagnosis not present

## 2021-07-12 DIAGNOSIS — Z91010 Allergy to peanuts: Secondary | ICD-10-CM | POA: Diagnosis not present

## 2021-07-19 DIAGNOSIS — J4540 Moderate persistent asthma, uncomplicated: Secondary | ICD-10-CM | POA: Diagnosis not present

## 2021-07-30 DIAGNOSIS — J019 Acute sinusitis, unspecified: Secondary | ICD-10-CM | POA: Diagnosis not present

## 2021-07-30 DIAGNOSIS — B9689 Other specified bacterial agents as the cause of diseases classified elsewhere: Secondary | ICD-10-CM | POA: Diagnosis not present

## 2021-08-18 ENCOUNTER — Other Ambulatory Visit: Payer: Self-pay

## 2021-08-18 ENCOUNTER — Ambulatory Visit: Payer: BC Managed Care – PPO | Admitting: Family Medicine

## 2021-08-18 ENCOUNTER — Encounter: Payer: Self-pay | Admitting: Family Medicine

## 2021-08-18 VITALS — BP 102/74 | HR 77 | Temp 98.1°F | Ht 71.0 in | Wt 230.0 lb

## 2021-08-18 DIAGNOSIS — J310 Chronic rhinitis: Secondary | ICD-10-CM | POA: Diagnosis not present

## 2021-08-18 DIAGNOSIS — J329 Chronic sinusitis, unspecified: Secondary | ICD-10-CM

## 2021-08-18 DIAGNOSIS — J029 Acute pharyngitis, unspecified: Secondary | ICD-10-CM

## 2021-08-18 LAB — POCT RAPID STREP A (OFFICE): Rapid Strep A Screen: NEGATIVE

## 2021-08-18 LAB — POCT INFLUENZA A/B
Influenza A, POC: NEGATIVE
Influenza B, POC: NEGATIVE

## 2021-08-18 LAB — POC COVID19 BINAXNOW: SARS Coronavirus 2 Ag: NEGATIVE

## 2021-08-18 MED ORDER — QNASL 80 MCG/ACT NA AERS: 2.0000 | INHALATION_SPRAY | Freq: Every day | NASAL | 0 refills | Status: AC

## 2021-08-18 MED ORDER — AMOXICILLIN-POT CLAVULANATE 875-125 MG PO TABS: 1.0000 | ORAL_TABLET | Freq: Two times a day (BID) | ORAL | 0 refills | Status: AC

## 2021-08-18 NOTE — Assessment & Plan Note (Signed)
Patient presenting with 2-week history of congestion, nonproductive cough, and sore throat, denies any fevers or chills.  Of note he had similar symptomatology earlier December and was prescribed a course of Augmentin on 07/30/2021 which did address sore throat rapidly, gradually addressed congestion without full resolution.  He has been utilizing Flonase and his regular medications.  Examination reveals erythematous turbinates, nontender sinuses, fluid at the tympanic membranes without overt erythema, there are 2 areas of superficial ulceration in the oropharynx without exudate, no lymphadenopathy noted at the neck, clear lung fields throughout without wheezes, rales, rhonchi, benign heart sounds.  Patient with findings consistent with rhinosinusitis in the setting of asthma, recent viral/bacterial illness.  Given his reassuring vitals and clinical picture I have advised scheduled Qnasl in place of Flonase x2 weeks, daily oral antihistamine, and low threshold to initiate antibiotics in 1 week if symptoms fail to progress.  He will return to his normal medication regimen in 2 weeks time and was advised to contact our office for any recalcitrant symptomatology.

## 2021-08-18 NOTE — Progress Notes (Signed)
°  ° °  Primary Care / Sports Medicine Office Visit  Patient Information:  Patient ID: Brian Reeves, male DOB: 05-May-1986 Age: 36 y.o. MRN: [id number]   Brian Reeves is a pleasant 36 y.o. male presenting with the following:  Chief Complaint  Patient presents with   Sore Throat    X2 weeks; intermittent; took course amoxicillin with relief and now sore throat is back; 6/10 pain    Patient Active Problem List   Diagnosis Date Noted   Rhinosinusitis 08/18/2021   Sprain of right ankle 06/13/2021   Blood in stool    Meckel diverticulum 12/20/2015   Seasonal allergies 12/20/2015   Exercise-induced asthma 12/20/2015    Vitals:   08/18/21 1544  BP: 102/74  Pulse: 77  Temp: 98.1 F (36.7 C)  SpO2: 97%   Vitals:   08/18/21 1544  Weight: 230 lb (104.3 kg)  Height: 5\' 11"  (1.803 m)   Body mass index is 32.08 kg/m.  No results found.   Independent interpretation of notes and tests performed by another provider:   None  Procedures performed:   None  Pertinent History, Exam, Impression, and Recommendations:   Rhinosinusitis Patient presenting with 2-week history of congestion, nonproductive cough, and sore throat, denies any fevers or chills.  Of note he had similar symptomatology earlier December and was prescribed a course of Augmentin on 07/30/2021 which did address sore throat rapidly, gradually addressed congestion without full resolution.  He has been utilizing Flonase and his regular medications.  Examination reveals erythematous turbinates, nontender sinuses, fluid at the tympanic membranes without overt erythema, there are 2 areas of superficial ulceration in the oropharynx without exudate, no lymphadenopathy noted at the neck, clear lung fields throughout without wheezes, rales, rhonchi, benign heart sounds.  Patient with findings consistent with rhinosinusitis in the setting of asthma, recent viral/bacterial illness.  Given his reassuring vitals and clinical  picture I have advised scheduled Qnasl in place of Flonase x2 weeks, daily oral antihistamine, and low threshold to initiate antibiotics in 1 week if symptoms fail to progress.  He will return to his normal medication regimen in 2 weeks time and was advised to contact our office for any recalcitrant symptomatology.   Orders & Medications Meds ordered this encounter  Medications   Beclomethasone Dipropionate (QNASL) 80 MCG/ACT AERS    Sig: Place 2 sprays into both nostrils daily.    Dispense:  1 each    Refill:  0   amoxicillin-clavulanate (AUGMENTIN) 875-125 MG tablet    Sig: Take 1 tablet by mouth 2 (two) times daily for 10 days.    Dispense:  20 tablet    Refill:  0   Orders Placed This Encounter  Procedures   POC COVID-19   POCT Influenza A/B   POCT rapid strep A     Return if symptoms worsen or fail to improve.     08/01/2021, MD   Primary Care Sports Medicine [facility]

## 2021-08-18 NOTE — Patient Instructions (Signed)
-   Suspend Flovent HFA and Flonase x2 weeks - Start Qnasl, 2 sprays daily x2 weeks - Start over-the-counter antihistamine (Allegra, Claritin, etc.) daily x2 weeks - If symptoms fail to improve at the 1 week mark, start antibiotics and take for full 10-day course - Continue your other medications and resume remainder after 2 weeks - Contact us for any questions and follow-up as needed

## 2021-10-27 DIAGNOSIS — J4520 Mild intermittent asthma, uncomplicated: Secondary | ICD-10-CM | POA: Diagnosis not present

## 2021-10-27 DIAGNOSIS — T7801XS Anaphylactic reaction due to peanuts, sequela: Secondary | ICD-10-CM | POA: Diagnosis not present

## 2021-10-27 DIAGNOSIS — J309 Allergic rhinitis, unspecified: Secondary | ICD-10-CM | POA: Diagnosis not present

## 2021-10-27 DIAGNOSIS — E559 Vitamin D deficiency, unspecified: Secondary | ICD-10-CM | POA: Diagnosis not present

## 2021-11-23 DIAGNOSIS — M67432 Ganglion, left wrist: Secondary | ICD-10-CM | POA: Diagnosis not present

## 2022-05-09 DIAGNOSIS — D225 Melanocytic nevi of trunk: Secondary | ICD-10-CM | POA: Diagnosis not present

## 2022-05-09 DIAGNOSIS — Z86018 Personal history of other benign neoplasm: Secondary | ICD-10-CM | POA: Diagnosis not present

## 2022-05-09 DIAGNOSIS — Z09 Encounter for follow-up examination after completed treatment for conditions other than malignant neoplasm: Secondary | ICD-10-CM | POA: Diagnosis not present

## 2022-05-09 DIAGNOSIS — L578 Other skin changes due to chronic exposure to nonionizing radiation: Secondary | ICD-10-CM | POA: Diagnosis not present

## 2022-07-25 DIAGNOSIS — L821 Other seborrheic keratosis: Secondary | ICD-10-CM | POA: Diagnosis not present

## 2022-10-09 DIAGNOSIS — Z713 Dietary counseling and surveillance: Secondary | ICD-10-CM | POA: Diagnosis not present

## 2022-10-30 DIAGNOSIS — Z713 Dietary counseling and surveillance: Secondary | ICD-10-CM | POA: Diagnosis not present

## 2023-03-08 ENCOUNTER — Ambulatory Visit (LOCAL_COMMUNITY_HEALTH_CENTER): Payer: Managed Care, Other (non HMO)

## 2023-03-08 ENCOUNTER — Ambulatory Visit: Payer: Managed Care, Other (non HMO)

## 2023-03-08 DIAGNOSIS — Z23 Encounter for immunization: Secondary | ICD-10-CM

## 2023-03-08 DIAGNOSIS — Z719 Counseling, unspecified: Secondary | ICD-10-CM

## 2023-03-08 NOTE — Progress Notes (Signed)
Pt seen in clinic, requested Hep A vaccine prior travelling to [place]. Updated Epic record into [facility], reviewed. Eligible per [facility], no other vaccine record provided. Given VIS, administered vaccine and tolerated well. Given an updated [facility] copies, informed of next vaccine visits, pt verbalized understanding. M.Bradshaw Minihan, LPN.

## 2023-08-11 IMAGING — CR DG CHEST 2V
2 series · 2 of 2 positions shown · non-contrast
Comparison: None.

CLINICAL DATA: Persistent cough for 1 and half weeks, history of
asthma

EXAM:
CHEST - 2 VIEW

[chest pa]
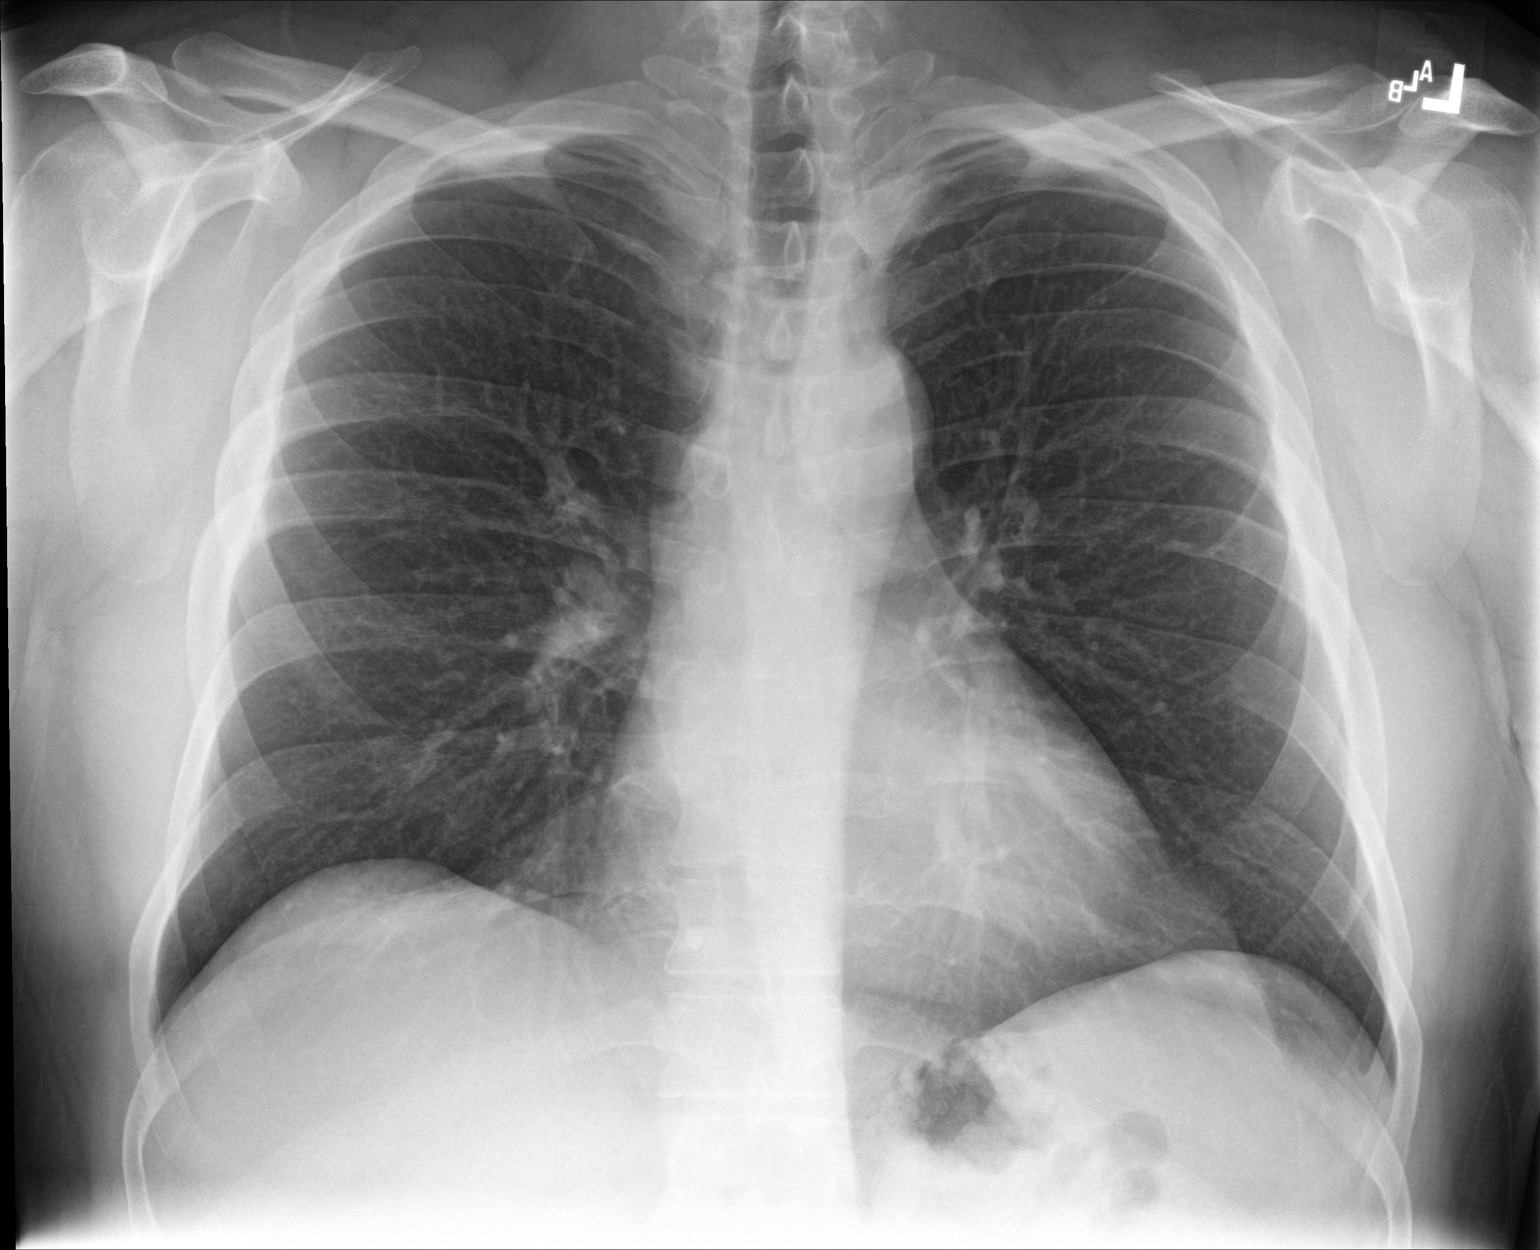

[chest lat]
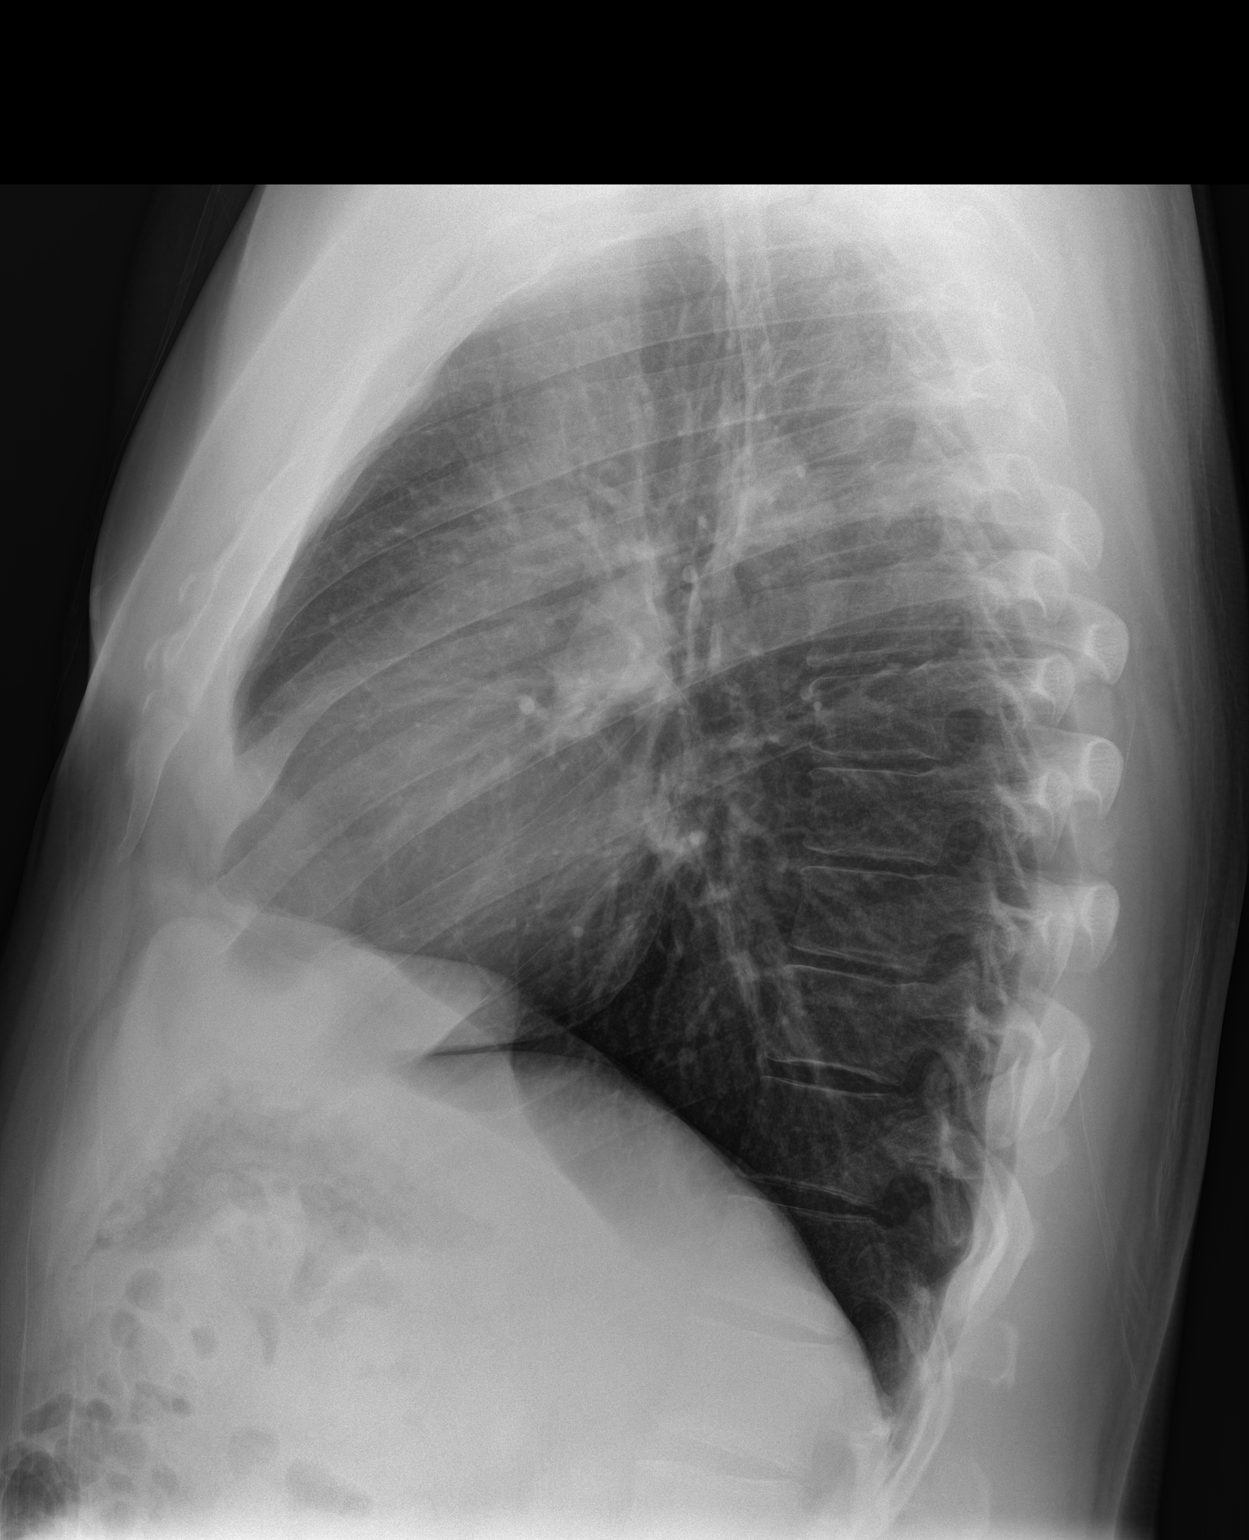

[2 of 2 positions shown; findings below may reference images not displayed]

FINDINGS: The cardiomediastinal silhouette is normal.

There is no focal consolidation or pulmonary edema. There is no
pleural effusion or pneumothorax.

There is no acute osseous abnormality.
IMPRESSION: No radiographic evidence of acute cardiopulmonary process.
